# Patient Record
Sex: Male | Born: 1950
Health system: Southern US, Community
[De-identification: ages and names within clinical notes are randomized; demographics above are authoritative.]

## PROBLEM LIST (undated history)

## (undated) DIAGNOSIS — I10 Essential (primary) hypertension: Secondary | ICD-10-CM

## (undated) DIAGNOSIS — Z9889 Other specified postprocedural states: Secondary | ICD-10-CM

## (undated) DIAGNOSIS — E119 Type 2 diabetes mellitus without complications: Secondary | ICD-10-CM

## (undated) DIAGNOSIS — R112 Nausea with vomiting, unspecified: Secondary | ICD-10-CM

## (undated) DIAGNOSIS — E785 Hyperlipidemia, unspecified: Secondary | ICD-10-CM

## (undated) DIAGNOSIS — H269 Unspecified cataract: Secondary | ICD-10-CM

## (undated) DIAGNOSIS — T8859XA Other complications of anesthesia, initial encounter: Secondary | ICD-10-CM

## (undated) HISTORY — PX: REMOVE AND REPLACE LENS: SHX6308

## (undated) HISTORY — PX: LASIK: SHX215

## (undated) HISTORY — DX: Unspecified cataract: H26.9

## (undated) HISTORY — PX: CATARACT EXTRACTION, BILATERAL: SHX1313

## (undated) HISTORY — PX: EYE SURGERY: SHX253

---

## 1981-03-29 HISTORY — PX: CYST REMOVAL NECK: SHX6281

## 2017-03-12 ENCOUNTER — Encounter (HOSPITAL_COMMUNITY): Payer: Self-pay

## 2017-03-12 ENCOUNTER — Emergency Department (HOSPITAL_COMMUNITY): Payer: 59

## 2017-03-12 ENCOUNTER — Emergency Department (HOSPITAL_COMMUNITY)
Admission: EM | Admit: 2017-03-12 | Discharge: 2017-03-12 | Disposition: A | Discharge status: Home / Self Care | Payer: 59 | Attending: Emergency Medicine | Admitting: Emergency Medicine

## 2017-03-12 ENCOUNTER — Other Ambulatory Visit: Payer: Self-pay

## 2017-03-12 DIAGNOSIS — Z79899 Other long term (current) drug therapy: Secondary | ICD-10-CM | POA: Diagnosis not present

## 2017-03-12 DIAGNOSIS — L03115 Cellulitis of right lower limb: Secondary | ICD-10-CM | POA: Diagnosis not present

## 2017-03-12 DIAGNOSIS — M25461 Effusion, right knee: Secondary | ICD-10-CM

## 2017-03-12 DIAGNOSIS — M1711 Unilateral primary osteoarthritis, right knee: Secondary | ICD-10-CM | POA: Diagnosis not present

## 2017-03-12 DIAGNOSIS — M25561 Pain in right knee: Secondary | ICD-10-CM

## 2017-03-12 DIAGNOSIS — M7989 Other specified soft tissue disorders: Secondary | ICD-10-CM

## 2017-03-12 DIAGNOSIS — I10 Essential (primary) hypertension: Secondary | ICD-10-CM | POA: Diagnosis not present

## 2017-03-12 DIAGNOSIS — Z7984 Long term (current) use of oral hypoglycemic drugs: Secondary | ICD-10-CM | POA: Insufficient documentation

## 2017-03-12 DIAGNOSIS — E119 Type 2 diabetes mellitus without complications: Secondary | ICD-10-CM | POA: Diagnosis not present

## 2017-03-12 DIAGNOSIS — R2241 Localized swelling, mass and lump, right lower limb: Secondary | ICD-10-CM | POA: Diagnosis present

## 2017-03-12 HISTORY — DX: Essential (primary) hypertension: I10

## 2017-03-12 HISTORY — DX: Type 2 diabetes mellitus without complications: E11.9

## 2017-03-12 HISTORY — DX: Hyperlipidemia, unspecified: E78.5

## 2017-03-12 LAB — CBC WITH DIFFERENTIAL/PLATELET
Basophils Absolute: 0 10*3/uL (ref 0.0–0.1)
Basophils Relative: 0 %
EOS ABS: 0.2 10*3/uL (ref 0.0–0.7)
EOS PCT: 3 %
HCT: 39.1 % (ref 39.0–52.0)
HEMOGLOBIN: 12.7 g/dL — AB (ref 13.0–17.0)
LYMPHS ABS: 1 10*3/uL (ref 0.7–4.0)
Lymphocytes Relative: 12 %
MCH: 27.4 pg (ref 26.0–34.0)
MCHC: 32.5 g/dL (ref 30.0–36.0)
MCV: 84.3 fL (ref 78.0–100.0)
MONO ABS: 0.7 10*3/uL (ref 0.1–1.0)
MONOS PCT: 9 %
NEUTROS PCT: 76 %
Neutro Abs: 6.5 10*3/uL (ref 1.7–7.7)
Platelets: 280 10*3/uL (ref 150–400)
RBC: 4.64 MIL/uL (ref 4.22–5.81)
RDW: 13.2 % (ref 11.5–15.5)
WBC: 8.5 10*3/uL (ref 4.0–10.5)

## 2017-03-12 LAB — BASIC METABOLIC PANEL
Anion gap: 6 (ref 5–15)
BUN: 21 mg/dL — AB (ref 6–20)
CALCIUM: 8.4 mg/dL — AB (ref 8.9–10.3)
CO2: 26 mmol/L (ref 22–32)
CREATININE: 0.91 mg/dL (ref 0.61–1.24)
Chloride: 103 mmol/L (ref 101–111)
GFR calc Af Amer: >60 mL/min (ref >60)
GFR calc non Af Amer: >60 mL/min (ref >60)
GLUCOSE: 123 mg/dL — AB (ref 65–99)
Potassium: 3.4 mmol/L — ABNORMAL LOW (ref 3.5–5.1)
Sodium: 135 mmol/L (ref 135–145)

## 2017-03-12 LAB — URIC ACID: Uric Acid, Serum: 4.6 mg/dL (ref 4.4–7.6)

## 2017-03-12 LAB — D-DIMER, QUANTITATIVE (NOT AT ARMC): D DIMER QUANT: 3.18 ug{FEU}/mL — AB (ref 0.00–0.50)

## 2017-03-12 MED ORDER — PREDNISONE 10 MG PO TABS: 40.0000 mg | ORAL_TABLET | Freq: Every day | ORAL | 0 refills | Status: DC

## 2017-03-12 MED ORDER — DOXYCYCLINE HYCLATE 100 MG PO TABS
100.0000 mg | ORAL_TABLET | Freq: Once | ORAL | Status: AC
  Administered 2017-03-12: 100 mg via ORAL
  Filled 2017-03-12: qty 1

## 2017-03-12 MED ORDER — CEFAZOLIN SODIUM-DEXTROSE 1-4 GM/50ML-% IV SOLN
1.0000 g | Freq: Once | INTRAVENOUS | Status: AC
  Administered 2017-03-12: 1 g via INTRAVENOUS
  Filled 2017-03-12: qty 50

## 2017-03-12 MED ORDER — DOXYCYCLINE HYCLATE 100 MG PO CAPS: 100.0000 mg | ORAL_CAPSULE | Freq: Two times a day (BID) | ORAL | 0 refills | Status: DC

## 2017-03-12 MED ORDER — PREDNISONE 10 MG PO TABS
60.0000 mg | ORAL_TABLET | Freq: Once | ORAL | Status: AC
  Administered 2017-03-12: 60 mg via ORAL
  Filled 2017-03-12: qty 1

## 2017-03-12 MED ORDER — KETOROLAC TROMETHAMINE 30 MG/ML IJ SOLN
30.0000 mg | Freq: Once | INTRAMUSCULAR | Status: AC
  Administered 2017-03-12: 30 mg via INTRAVENOUS
  Filled 2017-03-12: qty 1

## 2017-03-12 MED ORDER — HYDROCODONE-ACETAMINOPHEN 5-325 MG PO TABS: 1.0000 | ORAL_TABLET | Freq: Four times a day (QID) | ORAL | 0 refills | Status: DC | PRN

## 2017-03-12 MED ORDER — IOPAMIDOL (ISOVUE-370) INJECTION 76%
100.0000 mL | Freq: Once | INTRAVENOUS | Status: AC | PRN
  Administered 2017-03-12: 100 mL via INTRAVENOUS

## 2017-03-12 NOTE — ED Notes (Signed)
CT made aware that the pt had an order for an [place] of his right lower leg.

## 2017-03-12 NOTE — Discharge Instructions (Signed)
Take prednisone as directed.  Also take the antibiotic doxycycline for the next 7 days.  Elevate right leg is much as possible.  Workup shows no evidence of any blood clots.  Also short course of pain medicine provided as needed.  Return for any new or worse symptoms

## 2017-03-12 NOTE — ED Notes (Signed)
Pt taken to US

## 2017-03-12 NOTE — ED Triage Notes (Signed)
Pt has swelling, redness and pain to right leg from the knee down worsening over the past 3 days.

## 2017-03-12 NOTE — ED Provider Notes (Signed)
[facility] EMERGENCY DEPARTMENT Provider Note   CSN: [id number] Arrival date & time: 03/12/17  0058  Time seen 02:45 AM   History   Chief Complaint Chief Complaint  Patient presents with  . Leg Swelling    HPI Malik Bowen is a 66 y.o. male.  HPI patient states December 7 he had pain in the MTP joint of his right great toe that was gone the following day.  The following day, December 8 he started having the pain and swelling in his right knee.  He states over the past 3 days he has started getting swelling and achiness of his whole right lower leg and now up into the right thigh.  He describes it as an aching discomfort.  He thinks he has had fever and chills that started December 9.  They are undocumented.  He denies nausea or vomiting.  He denies chest pain or shortness of breath.  He states he has been using ice without relief.  He states he has never had this happen before.  He denies any prior history of gout, he denies any family history of gout.  He states however he has had injections in his right knee at least twice in the past, the last time was a couple years ago.  This was done by his doctors in [place].  He states he flies to [place] several times a month, the last time was about 2 weeks ago.  He states he flies by private jet and he is in the plane about an hour.  He also does a lot of computer work and has been sitting at his desk working especially with the snow we have had.  PCP [facility], first appointment January 2  Past Medical History:  Diagnosis Date  . Diabetes mellitus without complication ([place])   . Hyperlipidemia   . Hypertension     There are no active problems to display for this patient.   History reviewed. No pertinent surgical history.     Home Medications    Prior to Admission medications   Medication Sig Start Date End Date Taking? Authorizing Provider  atorvastatin (LIPITOR) 40 MG tablet Take 40 mg by mouth daily.   Yes [provider]  losartan-hydrochlorothiazide (HYZAAR) 100-25 MG tablet Take 1 tablet by mouth daily.   Yes [provider]  metFORMIN (GLUCOPHAGE) 500 MG tablet Take by mouth 2 (two) times daily with a meal.   Yes [provider]  chlorhexidine (PERIDEX) 0.12 % solution TAKE ONE CAPFUL, SWISH, AND HOLD FOR 2 MINUTES THE SPIT TWICE A DAY 01/21/17   [provider]  tadalafil (CIALIS) 20 MG tablet  12/31/16   [provider]    Family History No family history on file.  Social History Social History   Tobacco Use  . Smoking status: Never Smoker  . Smokeless tobacco: Never Used  Substance Use Topics  . Alcohol use: No    Frequency: Never  . Drug use: No  employed   Allergies   Patient has no known allergies.   Review of Systems Review of Systems  All other systems reviewed and are negative.    Physical Exam Updated Vital Signs BP (!) 141/76 (BP Location: Right Arm)   Pulse 79   Temp 98 F (36.7 C) (Oral)   Resp 18   Ht 5\' 11"  (1.803 m)   Wt 107 kg (236 lb)   SpO2 97%   BMI 32.92 kg/m   Vital  signs normal    Physical Exam  Constitutional: He is oriented to person, place, and time. He appears well-developed and well-nourished. No distress.  HENT:  Head: Normocephalic and atraumatic.  Right Ear: External ear normal.  Left Ear: External ear normal.  Nose: Nose normal.  Eyes: Conjunctivae and EOM are normal.  Neck: Normal range of motion.  Cardiovascular: Normal rate.  Pulmonary/Chest: Effort normal. No respiratory distress.  Musculoskeletal: He exhibits edema and tenderness.  Patient is noted to have diffuse swelling of his right leg that starts just above his right knee.  He has a lot of redness and warmth to touch especially on the medial aspect of his lower leg between the knee and the ankle.  He has pitting edema of the lower leg and on the dorsum of his foot.  He currently has MTP joint is nontender to palpation but he  indicates that is where the pain initially started.  He appears to have a moderate effusion of his right knee however he does not have pain when I palpate the joint spaces or his patella.  Neurological: He is alert and oriented to person, place, and time. No cranial nerve deficit.  Skin: Skin is warm and dry. There is erythema.  Nursing note and vitals reviewed.      ED Treatments / Results  Labs (all labs ordered are listed, but only abnormal results are displayed) Results for orders placed or performed during the hospital encounter of 81/01/75  Basic metabolic panel  Result Value Ref Range   Sodium 135 135 - 145 mmol/L   Potassium 3.4 (L) 3.5 - 5.1 mmol/L   Chloride 103 101 - 111 mmol/L   CO2 26 22 - 32 mmol/L   Glucose, Bld 123 (H) 65 - 99 mg/dL   BUN 21 (H) 6 - 20 mg/dL   Creatinine, Ser 0.91 0.61 - 1.24 mg/dL   Calcium 8.4 (L) 8.9 - 10.3 mg/dL   GFR calc non Af Amer >60 >60 mL/min   GFR calc Af Amer >60 >60 mL/min   Anion gap 6 5 - 15  CBC with Differential  Result Value Ref Range   WBC 8.5 4.0 - 10.5 K/uL   RBC 4.64 4.22 - 5.81 MIL/uL   Hemoglobin 12.7 (L) 13.0 - 17.0 g/dL   HCT 39.1 39.0 - 52.0 %   MCV 84.3 78.0 - 100.0 fL   MCH 27.4 26.0 - 34.0 pg   MCHC 32.5 30.0 - 36.0 g/dL   RDW 13.2 11.5 - 15.5 %   Platelets 280 150 - 400 K/uL   Neutrophils Relative % 76 %   Neutro Abs 6.5 1.7 - 7.7 K/uL   Lymphocytes Relative 12 %   Lymphs Abs 1.0 0.7 - 4.0 K/uL   Monocytes Relative 9 %   Monocytes Absolute 0.7 0.1 - 1.0 K/uL   Eosinophils Relative 3 %   Eosinophils Absolute 0.2 0.0 - 0.7 K/uL   Basophils Relative 0 %   Basophils Absolute 0.0 0.0 - 0.1 K/uL  D-dimer, quantitative  Result Value Ref Range   D-Dimer, Quant 3.18 (H) 0.00 - 0.50 ug/mL-FEU  Uric acid  Result Value Ref Range   Uric Acid, Serum 4.6 4.4 - 7.6 mg/dL    Laboratory interpretation all normal except elevated d-dimer     EKG  EKG Interpretation None       Radiology Ct Angio Chest Pe  W/cm &/or Wo Cm  Result Date: 03/12/2017 CLINICAL DATA:  Acute onset of right leg  swelling, erythema and pain. Elevated D-dimer. EXAM: CT ANGIOGRAPHY CHEST WITH CONTRAST TECHNIQUE: Multidetector CT imaging of the chest was performed using the standard protocol during bolus administration of intravenous contrast. Multiplanar CT image reconstructions and MIPs were obtained to evaluate the vascular anatomy. CONTRAST:  191mL ISOVUE-370 IOPAMIDOL (ISOVUE-370) INJECTION 76% COMPARISON:  None. FINDINGS: Cardiovascular:  There is no evidence of pulmonary embolus. The heart is normal in size. The thoracic aorta is unremarkable. The great vessels are within normal limits. Mediastinum/Nodes: The mediastinum is unremarkable appearance. No mediastinal lymphadenopathy is seen. No pericardial effusion is identified. The thyroid gland is unremarkable. No axillary lymphadenopathy is appreciated. Lungs/Pleura: The lungs are essentially clear bilaterally. No focal consolidation, pleural effusion or pneumothorax is seen. No masses are identified. Upper Abdomen: The visualized portions of the liver and spleen are unremarkable. The visualized portions of the gallbladder, pancreas and adrenal glands are within normal limits. Musculoskeletal: No acute osseous abnormalities are identified. The visualized musculature is unremarkable in appearance. Review of the MIP images confirms the above findings. IMPRESSION: 1. No evidence of pulmonary embolus. 2. Lungs clear bilaterally. Electronically Signed   By: Garald Balding M.D.   On: 03/12/2017 04:41   Dg Knee Complete 4 Views Right  Result Date: 03/12/2017 CLINICAL DATA:  Right knee pain, redness and swelling. EXAM: RIGHT KNEE - COMPLETE 4+ VIEW COMPARISON:  None. FINDINGS: No fracture or subluxation. Medial tibiofemoral and patellofemoral joint space narrowing. Tricompartmental peripheral spurring. No bony destructive change. There is a moderate joint effusion. Mild soft tissue edema.  IMPRESSION: Moderate tricompartmental osteoarthritis. Moderate joint effusion. No acute bony abnormality. Electronically Signed   By: Jeb Levering M.D.   On: 03/12/2017 04:08    Procedures Procedures (including critical care time)  Medications Ordered in ED Medications  ketorolac (TORADOL) 30 MG/ML injection 30 mg (30 mg Intravenous Given 03/12/17 0320)  iopamidol (ISOVUE-370) 76 % injection 100 mL (100 mLs Intravenous Contrast Given 03/12/17 0429)  ceFAZolin (ANCEF) IVPB 1 g/50 mL premix (0 g Intravenous Stopped 03/12/17 0703)     Initial Impression / Assessment and Plan / ED Course  I have reviewed the triage vital signs and the nursing notes.  Pertinent labs & imaging results that were available during my care of the patient were reviewed by me and considered in my medical decision making (see chart for details).     The patient's symptoms are very suspicious for acute gout, although with his sedentary job and flying concern is also for DVT.  He also could have cellulitis.  Laboratory testing was done including screen for blood clots and gout.  He was given IV Toradol.  X-rays obtained of his right knee.  Recheck at 4:20 AM patient states his pain is better after the Toradol.  We discussed his test results including the elevated d-dimer.  CT angiogram of the chest was done to rule out PE, I am unable to assess his lower leg for DVT because they are not available at night.  Recheck at 5:35 AM patient was given the results of his CT scan.  He is willing to stay in the ED and get the venous Doppler ultrasound done to look for DVT.  Cellulitis is still consideration although he does not have fever or an elevated white blood cell count.  He was given IV antibiotics while waiting to be evaluated for DVT.  He states his pain is much improved after the IV Toradol and he is now able to sleep.  8:20 AM patient turned  over to Dr. Rogene Houston at change of shift to get the results of his Doppler  ultrasound.  If it was negative he will be discharged with anti-inflammatory and antibiotics, if he has DVT he will need to be started on a blood thinner.  Final Clinical Impressions(s) / ED Diagnoses   Final diagnoses:  Pain and swelling of right knee  Cellulitis of right lower extremity  Arthritis of right knee    Disposition pending  Rolland Porter, MD, Barbette Or, MD 03/12/17 [phone]

## 2017-03-12 NOTE — ED Provider Notes (Signed)
Doppler studies of the right leg without evidence of any blood clot.  Patient does have some erythema to the anterior shin as well as swelling to the knee and ankle.  Will treat as if this could be inflammatory arthritis which she has had trouble with in the past and possible component of cellulitis.  Will treat with prednisone pain medication in the antibiotic doxycycline.  Patient is already received Ancef here.   Fredia Sorrow, MD 03/12/17 1126

## 2017-06-01 DIAGNOSIS — M9902 Segmental and somatic dysfunction of thoracic region: Secondary | ICD-10-CM | POA: Diagnosis not present

## 2017-06-01 DIAGNOSIS — M9903 Segmental and somatic dysfunction of lumbar region: Secondary | ICD-10-CM | POA: Diagnosis not present

## 2017-06-01 DIAGNOSIS — M545 Low back pain: Secondary | ICD-10-CM | POA: Diagnosis not present

## 2017-06-01 DIAGNOSIS — M542 Cervicalgia: Secondary | ICD-10-CM | POA: Diagnosis not present

## 2017-06-01 DIAGNOSIS — M9901 Segmental and somatic dysfunction of cervical region: Secondary | ICD-10-CM | POA: Diagnosis not present

## 2017-06-17 DIAGNOSIS — M9902 Segmental and somatic dysfunction of thoracic region: Secondary | ICD-10-CM | POA: Diagnosis not present

## 2017-06-17 DIAGNOSIS — M545 Low back pain: Secondary | ICD-10-CM | POA: Diagnosis not present

## 2017-06-17 DIAGNOSIS — M542 Cervicalgia: Secondary | ICD-10-CM | POA: Diagnosis not present

## 2017-06-17 DIAGNOSIS — M9901 Segmental and somatic dysfunction of cervical region: Secondary | ICD-10-CM | POA: Diagnosis not present

## 2017-06-17 DIAGNOSIS — M9903 Segmental and somatic dysfunction of lumbar region: Secondary | ICD-10-CM | POA: Diagnosis not present

## 2017-06-30 DIAGNOSIS — M25531 Pain in right wrist: Secondary | ICD-10-CM | POA: Diagnosis not present

## 2017-06-30 DIAGNOSIS — M25431 Effusion, right wrist: Secondary | ICD-10-CM | POA: Diagnosis not present

## 2017-06-30 DIAGNOSIS — A6920 Lyme disease, unspecified: Secondary | ICD-10-CM | POA: Diagnosis not present

## 2017-07-01 ENCOUNTER — Ambulatory Visit (HOSPITAL_COMMUNITY)
Admission: RE | Admit: 2017-07-01 | Discharge: 2017-07-01 | Disposition: A | Discharge status: Home / Self Care | Payer: Medicare Other | Source: Ambulatory Visit | Attending: Physician Assistant | Admitting: Physician Assistant

## 2017-07-01 ENCOUNTER — Other Ambulatory Visit (HOSPITAL_COMMUNITY): Payer: Self-pay | Admitting: Physician Assistant

## 2017-07-01 DIAGNOSIS — E6609 Other obesity due to excess calories: Secondary | ICD-10-CM | POA: Diagnosis not present

## 2017-07-01 DIAGNOSIS — M25531 Pain in right wrist: Secondary | ICD-10-CM

## 2017-07-01 DIAGNOSIS — M25431 Effusion, right wrist: Secondary | ICD-10-CM | POA: Diagnosis not present

## 2017-07-01 DIAGNOSIS — Z6835 Body mass index (BMI) 35.0-35.9, adult: Secondary | ICD-10-CM | POA: Insufficient documentation

## 2017-07-01 DIAGNOSIS — M189 Osteoarthritis of first carpometacarpal joint, unspecified: Secondary | ICD-10-CM | POA: Diagnosis not present

## 2017-07-11 DIAGNOSIS — M9903 Segmental and somatic dysfunction of lumbar region: Secondary | ICD-10-CM | POA: Diagnosis not present

## 2017-07-11 DIAGNOSIS — M9902 Segmental and somatic dysfunction of thoracic region: Secondary | ICD-10-CM | POA: Diagnosis not present

## 2017-07-11 DIAGNOSIS — M542 Cervicalgia: Secondary | ICD-10-CM | POA: Diagnosis not present

## 2017-07-11 DIAGNOSIS — M545 Low back pain: Secondary | ICD-10-CM | POA: Diagnosis not present

## 2017-07-11 DIAGNOSIS — M9901 Segmental and somatic dysfunction of cervical region: Secondary | ICD-10-CM | POA: Diagnosis not present

## 2017-07-19 DIAGNOSIS — M13831 Other specified arthritis, right wrist: Secondary | ICD-10-CM | POA: Diagnosis not present

## 2017-07-19 DIAGNOSIS — M19039 Primary osteoarthritis, unspecified wrist: Secondary | ICD-10-CM | POA: Insufficient documentation

## 2017-07-21 DIAGNOSIS — R202 Paresthesia of skin: Secondary | ICD-10-CM | POA: Diagnosis not present

## 2017-07-21 DIAGNOSIS — R7309 Other abnormal glucose: Secondary | ICD-10-CM | POA: Diagnosis not present

## 2017-08-15 ENCOUNTER — Ambulatory Visit (INDEPENDENT_AMBULATORY_CARE_PROVIDER_SITE_OTHER): Payer: Medicare Other | Admitting: Ophthalmology

## 2017-08-15 ENCOUNTER — Encounter (INDEPENDENT_AMBULATORY_CARE_PROVIDER_SITE_OTHER): Payer: Self-pay | Admitting: Ophthalmology

## 2017-08-15 DIAGNOSIS — E119 Type 2 diabetes mellitus without complications: Secondary | ICD-10-CM | POA: Diagnosis not present

## 2017-08-15 DIAGNOSIS — H25813 Combined forms of age-related cataract, bilateral: Secondary | ICD-10-CM

## 2017-08-15 DIAGNOSIS — H43813 Vitreous degeneration, bilateral: Secondary | ICD-10-CM

## 2017-08-15 DIAGNOSIS — H3581 Retinal edema: Secondary | ICD-10-CM

## 2017-08-15 DIAGNOSIS — Z9889 Other specified postprocedural states: Secondary | ICD-10-CM | POA: Diagnosis not present

## 2017-08-15 NOTE — Progress Notes (Signed)
[facility] Note  08/15/2017     CHIEF COMPLAINT Patient presents for Retina Evaluation   HISTORY OF PRESENT ILLNESS: Malik Bowen is a 67 y.o. male who presents to the clinic today for:   HPI    Retina Evaluation    In both eyes.  Associated Symptoms Floaters.  Negative for Flashes, Pain, Trauma, Fever, Redness, Scalp Tenderness, Weight Loss, Fatigue, Jaw Claudication, Photophobia, Distortion, Blind Spot, Glare and Shoulder/Hip pain.  Context:  distance vision, mid-range vision and near vision.  Treatments tried include surgery.  Response to treatment was significant improvement.  I, the attending physician,  performed the HPI with the patient and updated documentation appropriately.          Comments    Patient presents today for retina  evaluation. Patient states"he is here to have  pre diabetic exam and to R/O mac. degeneration  , his wife Malik Bowen is a patient here. Pt reports he has had floaters for appx 50 yrs. ,he wears progressive lens, he states he has a sigmatism Os. His A1c was 6.1 three weeks ago. Pt reports he had Lasik sx OU 2002 in [place]. Denies gtt's       Last edited by Bernarda Caffey, MD on 08/15/2017  9:01 AM. (History)    Pt states he takes metformin for pre-diabetes;  Referring physician: Redmond School, MD [address] [place], [place] [address]  HISTORICAL INFORMATION:   Selected notes from the [place] referred for DM exam   CURRENT MEDICATIONS: No current outpatient medications on file. (Ophthalmic Drugs)   No current facility-administered medications for this visit.  (Ophthalmic Drugs)   Current Outpatient Medications (Other)  Medication Sig  . HYDROcodone-acetaminophen (NORCO/VICODIN) 5-325 MG tablet Take 1-2 tablets by mouth every 6 (six) hours as needed.  Marland Kitchen atorvastatin (LIPITOR) 40 MG tablet Take 40 mg by mouth daily.  Marland Kitchen doxycycline (VIBRAMYCIN) 100 MG capsule Take 1 capsule (100 mg total) by  mouth 2 (two) times daily. (Patient not taking: Reported on 08/15/2017)  . guaifenesin (ROBITUSSIN) 100 MG/5ML syrup Take 200 mg by mouth 3 (three) times daily as needed for cough.  . losartan-hydrochlorothiazide (HYZAAR) 100-25 MG tablet Take 1 tablet by mouth daily.  . metFORMIN (GLUCOPHAGE) 500 MG tablet Take 500 mg by mouth daily with breakfast.   . predniSONE (DELTASONE) 10 MG tablet Take 4 tablets (40 mg total) by mouth daily. (Patient not taking: Reported on 08/15/2017)  . tadalafil (CIALIS) 20 MG tablet Take 20 mg by mouth daily as needed.    No current facility-administered medications for this visit.  (Other)      REVIEW OF SYSTEMS: ROS    Positive for: Musculoskeletal, Endocrine, Eyes   Negative for: Constitutional, Gastrointestinal, Neurological, Skin, Genitourinary, HENT, Cardiovascular, Respiratory, Psychiatric, Allergic/Imm, Heme/Lymph   Last edited by Zenovia Jordan, LPN on 6/38/4536  4:68 AM. (History)       ALLERGIES Allergies  Allergen Reactions  . Other     Walnuts and strawberrys cause mouth sores    PAST MEDICAL HISTORY Past Medical History:  Diagnosis Date  . Diabetes mellitus without complication ([place])   . Hyperlipidemia   . Hypertension    Past Surgical History:  Procedure Laterality Date  . EYE SURGERY    . LASIK      FAMILY HISTORY Family History  Problem Relation Age of Onset  . Heart failure Father   . Heart failure Brother     SOCIAL HISTORY Social  History   Tobacco Use  . Smoking status: Never Smoker  . Smokeless tobacco: Never Used  Substance Use Topics  . Alcohol use: No    Frequency: Never  . Drug use: No         OPHTHALMIC EXAM:  Base Eye Exam    Visual Acuity (Bowen - Linear)      Right Left   Dist cc 20/20 20/20   Dist ph cc 20/20 20/20   Correction:  Glasses       Tonometry (Tonopen, 8:51 AM)      Right Left   Pressure 16 14       Pupils      Dark Light Shape React APD   Right 4 3 Round Brisk None    Left 4 3 Round Brisk None       Visual Fields (Counting fingers)      Left Right    Full Full       Extraocular Movement      Right Left    Full, Ortho Full, Ortho       Neuro/Psych    Oriented x3:  Yes   Mood/Affect:  Normal       Dilation    Both eyes:  1.0% Mydriacyl, 2.5% Phenylephrine @ 8:51 AM        Slit Lamp and Fundus Exam    Slit Lamp Exam      Right Left   Lids/Lashes Dermatochalasis - upper lid Dermatochalasis - upper lid   Conjunctiva/Sclera White and quiet White and quiet   Cornea Arcus, Debris in tear film, trace Punctate epithelial erosions, well healed lasik flap, 1+ Guttata Arcus, Debris in tear film, trace Punctate epithelial erosions, well healed lasik flap, 1+ Guttata   Anterior Chamber Deep and quiet Deep and quiet   Iris Round and dilated, No NVI Round and dilated, No NVI   Lens 2+ Nuclear sclerosis, 2+ Cortical cataract 2+ Nuclear sclerosis, 2+ Cortical cataract   Vitreous Vitreous syneresis, Posterior vitreous detachment, vitreous condesations inferiorly Vitreous syneresis, Posterior vitreous detachment, scattered vitreous condensations        Fundus Exam      Right Left   Disc Tilted disc, Temporal Peripapillary atrophy Tilted disc, Temporal Peripapillary atrophy and pigment   C/D Ratio 0.4 0.4   Macula Flat, Retinal pigment epithelial mottling, rare [place], No heme or edema Flat, mild Retinal pigment epithelial mottling, No heme or edema   Vessels Normal Trace AV crossing changes   Periphery Attached, pigmented Chorioretinal scar at 0730 Attached, pigmented Chorioretinal scarring at 0300        Refraction    Wearing Rx      Sphere Cylinder Axis Add   Right [place] +0.25 025 +2.50   Left -0.75 +1.00 158 +2.50   Age:  20yr   Type:  PAL          IMAGING AND PROCEDURES  Imaging and Procedures for @TODAY @  OCT, Retina - OU - Both Eyes       Right Eye Quality was good. Central Foveal Thickness: 297. Progression has no prior data.  Findings include normal foveal contour, no IRF, no SRF, epiretinal membrane (Mild ERM superior macula).   Left Eye Quality was good. Central Foveal Thickness: 284. Progression has no prior data. Findings include normal foveal contour, no IRF, no SRF (Trace ERM superior macula).   Notes *Images captured and stored on drive  Diagnosis / Impression:  No DME OU  Clinical management:  See  below  Abbreviations: NFP - Normal foveal profile. CME - cystoid macular edema. PED - pigment epithelial detachment. IRF - intraretinal fluid. SRF - subretinal fluid. EZ - ellipsoid zone. ERM - epiretinal membrane. ORA - outer retinal atrophy. ORT - outer retinal tubulation. SRHM - subretinal hyper-reflective material                 ASSESSMENT/PLAN:    ICD-10-CM   1. Diabetes mellitus type 2 without retinopathy ([place]) E11.9   2. Posterior vitreous detachment of both eyes H43.813   3. Retinal edema H35.81 OCT, Retina - OU - Both Eyes  4. Hx of LASIK Z98.890   5. Combined forms of age-related cataract of both eyes H25.813     1. Diabetes mellitus, type 2 without retinopathy - The incidence, risk factors for progression, natural history and treatment options for diabetic retinopathy  were discussed with patient.   - The need for close monitoring of blood glucose, blood pressure, and serum lipids, avoiding cigarette or any type of tobacco, and the need for long term follow up was also discussed with patient. - f/u in 1 year, sooner prn  2. PVD / vitreous syneresis - fairly prominent vitreous condensations - subjectively stable  - Discussed findings and prognosis - No RT or RD on 360 peripheral exam - Reviewed s/s of RT/RD - Strict return precautions for any such RT/RD signs/symptoms   2. No retinal edema on exam or OCT  3. Hx of Lasik OU-  - doing well - monitor  4. Combined forma age-related cataracts OU-  - The symptoms of cataract, surgical options, and treatments and risks were  discussed with patient. - discussed diagnosis and progression - not yet visually significant - monitor for now   Ophthalmic Meds Ordered this visit:  No orders of the defined types were placed in this encounter.      Return in about 1 year (around 08/16/2018) for DM exam, DFE, OCT.  There are no Patient Instructions on file for this visit.   Explained the diagnoses, plan, and follow up with the patient and they expressed understanding.  Patient expressed understanding of the importance of proper follow up care.  This document serves as a record of services personally performed by Gardiner Sleeper, MD, PhD. It was created on their behalf by Catha Brow, [place], a certified ophthalmic assistant. The creation of this record is the provider's dictation and/or activities during the visit.  Electronically signed by: Catha Brow, COA  05.20.19 9:49 AM     Gardiner Sleeper, M.D., Ph.D. Diseases & Surgery of the Retina and [place] 05.20.19  I have reviewed the above documentation for accuracy and completeness, and I agree with the above. Gardiner Sleeper, M.D., Ph.D. 08/15/17 9:50 AM     Abbreviations: M myopia (nearsighted); A astigmatism; H hyperopia (farsighted); P presbyopia; Mrx spectacle prescription;  CTL contact lenses; OD right eye; OS left eye; OU both eyes  XT exotropia; ET esotropia; PEK punctate epithelial keratitis; PEE punctate epithelial erosions; DES dry eye syndrome; MGD meibomian gland dysfunction; ATs artificial tears; PFAT's preservative free artificial tears; [place] nuclear sclerotic cataract; PSC posterior subcapsular cataract; ERM epi-retinal membrane; PVD posterior vitreous detachment; RD retinal detachment; DM diabetes mellitus; DR diabetic retinopathy; NPDR non-proliferative diabetic retinopathy; PDR proliferative diabetic retinopathy; CSME clinically significant macular edema; DME diabetic macular edema; dbh dot blot  hemorrhages; CWS cotton wool spot; POAG primary open angle glaucoma; C/D cup-to-disc ratio; HVF humphrey visual  field; GVF goldmann visual field; OCT optical coherence tomography; IOP intraocular pressure; BRVO Branch retinal vein occlusion; CRVO central retinal vein occlusion; CRAO central retinal artery occlusion; BRAO branch retinal artery occlusion; RT retinal tear; SB scleral buckle; PPV pars plana vitrectomy; VH Vitreous hemorrhage; PRP panretinal laser photocoagulation; IVK intravitreal kenalog; VMT vitreomacular traction; MH Macular hole;  NVD neovascularization of the disc; NVE neovascularization elsewhere; AREDS age related eye disease study; ARMD age related macular degeneration; POAG primary open angle glaucoma; EBMD epithelial/anterior basement membrane dystrophy; ACIOL anterior chamber intraocular lens; IOL intraocular lens; PCIOL posterior chamber intraocular lens; Phaco/IOL phacoemulsification with intraocular lens placement; [place] photorefractive keratectomy; LASIK laser assisted in situ keratomileusis; HTN hypertension; DM diabetes mellitus; COPD chronic obstructive pulmonary disease

## 2017-08-17 DIAGNOSIS — M545 Low back pain: Secondary | ICD-10-CM | POA: Diagnosis not present

## 2017-08-17 DIAGNOSIS — M9903 Segmental and somatic dysfunction of lumbar region: Secondary | ICD-10-CM | POA: Diagnosis not present

## 2017-08-17 DIAGNOSIS — M9902 Segmental and somatic dysfunction of thoracic region: Secondary | ICD-10-CM | POA: Diagnosis not present

## 2017-08-17 DIAGNOSIS — M9901 Segmental and somatic dysfunction of cervical region: Secondary | ICD-10-CM | POA: Diagnosis not present

## 2017-08-17 DIAGNOSIS — M542 Cervicalgia: Secondary | ICD-10-CM | POA: Diagnosis not present

## 2017-09-05 DIAGNOSIS — M545 Low back pain: Secondary | ICD-10-CM | POA: Diagnosis not present

## 2017-09-05 DIAGNOSIS — M9906 Segmental and somatic dysfunction of lower extremity: Secondary | ICD-10-CM | POA: Diagnosis not present

## 2017-09-05 DIAGNOSIS — M9902 Segmental and somatic dysfunction of thoracic region: Secondary | ICD-10-CM | POA: Diagnosis not present

## 2017-09-05 DIAGNOSIS — M9905 Segmental and somatic dysfunction of pelvic region: Secondary | ICD-10-CM | POA: Diagnosis not present

## 2017-09-05 DIAGNOSIS — M9903 Segmental and somatic dysfunction of lumbar region: Secondary | ICD-10-CM | POA: Diagnosis not present

## 2017-09-06 DIAGNOSIS — E782 Mixed hyperlipidemia: Secondary | ICD-10-CM | POA: Diagnosis not present

## 2017-09-06 DIAGNOSIS — I10 Essential (primary) hypertension: Secondary | ICD-10-CM | POA: Diagnosis not present

## 2017-11-03 DIAGNOSIS — M5441 Lumbago with sciatica, right side: Secondary | ICD-10-CM | POA: Diagnosis not present

## 2017-11-03 DIAGNOSIS — M9903 Segmental and somatic dysfunction of lumbar region: Secondary | ICD-10-CM | POA: Diagnosis not present

## 2017-11-03 DIAGNOSIS — M9905 Segmental and somatic dysfunction of pelvic region: Secondary | ICD-10-CM | POA: Diagnosis not present

## 2017-11-03 DIAGNOSIS — M9902 Segmental and somatic dysfunction of thoracic region: Secondary | ICD-10-CM | POA: Diagnosis not present

## 2017-11-25 DIAGNOSIS — M9905 Segmental and somatic dysfunction of pelvic region: Secondary | ICD-10-CM | POA: Diagnosis not present

## 2017-11-25 DIAGNOSIS — M9903 Segmental and somatic dysfunction of lumbar region: Secondary | ICD-10-CM | POA: Diagnosis not present

## 2017-11-25 DIAGNOSIS — M5441 Lumbago with sciatica, right side: Secondary | ICD-10-CM | POA: Diagnosis not present

## 2017-11-25 DIAGNOSIS — M9902 Segmental and somatic dysfunction of thoracic region: Secondary | ICD-10-CM | POA: Diagnosis not present

## 2017-12-22 DIAGNOSIS — M545 Low back pain: Secondary | ICD-10-CM | POA: Diagnosis not present

## 2017-12-22 DIAGNOSIS — Z1389 Encounter for screening for other disorder: Secondary | ICD-10-CM | POA: Diagnosis not present

## 2017-12-22 DIAGNOSIS — M5410 Radiculopathy, site unspecified: Secondary | ICD-10-CM | POA: Diagnosis not present

## 2018-01-12 DIAGNOSIS — M9905 Segmental and somatic dysfunction of pelvic region: Secondary | ICD-10-CM | POA: Diagnosis not present

## 2018-01-12 DIAGNOSIS — M5441 Lumbago with sciatica, right side: Secondary | ICD-10-CM | POA: Diagnosis not present

## 2018-01-12 DIAGNOSIS — M9903 Segmental and somatic dysfunction of lumbar region: Secondary | ICD-10-CM | POA: Diagnosis not present

## 2018-01-12 DIAGNOSIS — M9902 Segmental and somatic dysfunction of thoracic region: Secondary | ICD-10-CM | POA: Diagnosis not present

## 2018-01-30 DIAGNOSIS — Z23 Encounter for immunization: Secondary | ICD-10-CM | POA: Diagnosis not present

## 2018-01-30 DIAGNOSIS — M461 Sacroiliitis, not elsewhere classified: Secondary | ICD-10-CM | POA: Diagnosis not present

## 2018-01-30 DIAGNOSIS — M19042 Primary osteoarthritis, left hand: Secondary | ICD-10-CM | POA: Diagnosis not present

## 2018-02-14 DIAGNOSIS — Z23 Encounter for immunization: Secondary | ICD-10-CM | POA: Diagnosis not present

## 2018-03-08 DIAGNOSIS — M545 Low back pain: Secondary | ICD-10-CM | POA: Diagnosis not present

## 2018-03-08 DIAGNOSIS — M9903 Segmental and somatic dysfunction of lumbar region: Secondary | ICD-10-CM | POA: Diagnosis not present

## 2018-03-08 DIAGNOSIS — M9905 Segmental and somatic dysfunction of pelvic region: Secondary | ICD-10-CM | POA: Diagnosis not present

## 2018-03-08 DIAGNOSIS — M1991 Primary osteoarthritis, unspecified site: Secondary | ICD-10-CM | POA: Diagnosis not present

## 2018-03-08 DIAGNOSIS — Z0000 Encounter for general adult medical examination without abnormal findings: Secondary | ICD-10-CM | POA: Diagnosis not present

## 2018-03-08 DIAGNOSIS — E782 Mixed hyperlipidemia: Secondary | ICD-10-CM | POA: Diagnosis not present

## 2018-03-08 DIAGNOSIS — E119 Type 2 diabetes mellitus without complications: Secondary | ICD-10-CM | POA: Diagnosis not present

## 2018-03-08 DIAGNOSIS — R7309 Other abnormal glucose: Secondary | ICD-10-CM | POA: Diagnosis not present

## 2018-03-08 DIAGNOSIS — M9902 Segmental and somatic dysfunction of thoracic region: Secondary | ICD-10-CM | POA: Diagnosis not present

## 2018-03-08 DIAGNOSIS — Z1389 Encounter for screening for other disorder: Secondary | ICD-10-CM | POA: Diagnosis not present

## 2018-03-08 DIAGNOSIS — M5430 Sciatica, unspecified side: Secondary | ICD-10-CM | POA: Diagnosis not present

## 2018-03-08 DIAGNOSIS — Z0001 Encounter for general adult medical examination with abnormal findings: Secondary | ICD-10-CM | POA: Diagnosis not present

## 2018-03-14 DIAGNOSIS — M533 Sacrococcygeal disorders, not elsewhere classified: Secondary | ICD-10-CM | POA: Diagnosis not present

## 2018-04-21 DIAGNOSIS — M9902 Segmental and somatic dysfunction of thoracic region: Secondary | ICD-10-CM | POA: Diagnosis not present

## 2018-04-21 DIAGNOSIS — M9905 Segmental and somatic dysfunction of pelvic region: Secondary | ICD-10-CM | POA: Diagnosis not present

## 2018-04-21 DIAGNOSIS — M9903 Segmental and somatic dysfunction of lumbar region: Secondary | ICD-10-CM | POA: Diagnosis not present

## 2018-04-21 DIAGNOSIS — M545 Low back pain: Secondary | ICD-10-CM | POA: Diagnosis not present

## 2018-05-01 DIAGNOSIS — A6920 Lyme disease, unspecified: Secondary | ICD-10-CM | POA: Diagnosis not present

## 2018-05-17 DIAGNOSIS — M533 Sacrococcygeal disorders, not elsewhere classified: Secondary | ICD-10-CM | POA: Diagnosis not present

## 2018-05-26 DIAGNOSIS — M9903 Segmental and somatic dysfunction of lumbar region: Secondary | ICD-10-CM | POA: Diagnosis not present

## 2018-05-26 DIAGNOSIS — M9905 Segmental and somatic dysfunction of pelvic region: Secondary | ICD-10-CM | POA: Diagnosis not present

## 2018-05-26 DIAGNOSIS — M545 Low back pain: Secondary | ICD-10-CM | POA: Diagnosis not present

## 2018-05-26 DIAGNOSIS — M9902 Segmental and somatic dysfunction of thoracic region: Secondary | ICD-10-CM | POA: Diagnosis not present

## 2018-08-16 ENCOUNTER — Ambulatory Visit (INDEPENDENT_AMBULATORY_CARE_PROVIDER_SITE_OTHER): Payer: Medicare Other | Admitting: Ophthalmology

## 2018-08-16 ENCOUNTER — Other Ambulatory Visit: Payer: Self-pay

## 2018-08-16 ENCOUNTER — Encounter (INDEPENDENT_AMBULATORY_CARE_PROVIDER_SITE_OTHER): Payer: Self-pay | Admitting: Ophthalmology

## 2018-08-16 DIAGNOSIS — M25511 Pain in right shoulder: Secondary | ICD-10-CM | POA: Diagnosis not present

## 2018-08-16 DIAGNOSIS — M542 Cervicalgia: Secondary | ICD-10-CM | POA: Diagnosis not present

## 2018-08-16 DIAGNOSIS — R7309 Other abnormal glucose: Secondary | ICD-10-CM | POA: Diagnosis not present

## 2018-08-16 DIAGNOSIS — M9901 Segmental and somatic dysfunction of cervical region: Secondary | ICD-10-CM | POA: Diagnosis not present

## 2018-08-16 DIAGNOSIS — H3581 Retinal edema: Secondary | ICD-10-CM

## 2018-08-16 DIAGNOSIS — E119 Type 2 diabetes mellitus without complications: Secondary | ICD-10-CM

## 2018-08-16 DIAGNOSIS — Z9889 Other specified postprocedural states: Secondary | ICD-10-CM

## 2018-08-16 DIAGNOSIS — H43813 Vitreous degeneration, bilateral: Secondary | ICD-10-CM

## 2018-08-16 DIAGNOSIS — M9902 Segmental and somatic dysfunction of thoracic region: Secondary | ICD-10-CM | POA: Diagnosis not present

## 2018-08-16 DIAGNOSIS — H25813 Combined forms of age-related cataract, bilateral: Secondary | ICD-10-CM

## 2018-08-16 DIAGNOSIS — M9907 Segmental and somatic dysfunction of upper extremity: Secondary | ICD-10-CM | POA: Diagnosis not present

## 2018-08-16 NOTE — Progress Notes (Signed)
[facility] Note  08/16/2018     CHIEF COMPLAINT Patient presents for Retina Evaluation   HISTORY OF PRESENT ILLNESS: Malik Bowen is a 68 y.o. male who presents to the clinic today for:   HPI    Retina Evaluation    In both eyes.  Duration of 12 months.  Associated Symptoms Negative for Flashes, Blind Spot, Photophobia, Scalp Tenderness, Fever, Floaters, Pain, Glare, Jaw Claudication, Weight Loss, Distortion, Redness, Trauma, Shoulder/Hip pain and Fatigue.  Context:  distance vision, mid-range vision and near vision.  Treatments tried include no treatments.  I, the attending physician,  performed the HPI with the patient and updated documentation appropriately.          Comments    Patient states vision the same OU. Last a1c was 5.9. BS doesn't check regularly.        Last edited by Bernarda Caffey, MD on 08/16/2018  8:39 AM. (History)    Pt states he has been well over the past year, but his arthritis is getting pretty bad, he states he has not seen a rheumatologist yet, but plans to once things calm down from [place], he states his diabetes is under control, he states he sees the occasional floater  Referring physician: No referring provider defined for this encounter.  HISTORICAL INFORMATION:   Selected notes from the MEDICAL RECORD NUMBER Self referred for DM exam   CURRENT MEDICATIONS: No current outpatient medications on file. (Ophthalmic Drugs)   No current facility-administered medications for this visit.  (Ophthalmic Drugs)   Current Outpatient Medications (Other)  Medication Sig  . atorvastatin (LIPITOR) 40 MG tablet Take 40 mg by mouth daily.  Marland Kitchen doxycycline (VIBRAMYCIN) 100 MG capsule Take 1 capsule (100 mg total) by mouth 2 (two) times daily.  Marland Kitchen guaifenesin (ROBITUSSIN) 100 MG/5ML syrup Take 200 mg by mouth 3 (three) times daily as needed for cough.  Marland Kitchen HYDROcodone-acetaminophen (NORCO/VICODIN) 5-325 MG tablet Take 1-2 tablets by mouth  every 6 (six) hours as needed.  Marland Kitchen losartan-hydrochlorothiazide (HYZAAR) 100-25 MG tablet Take 1 tablet by mouth daily.  . metFORMIN (GLUCOPHAGE) 500 MG tablet Take 500 mg by mouth daily with breakfast.   . tadalafil (CIALIS) 20 MG tablet Take 20 mg by mouth daily as needed.   . predniSONE (DELTASONE) 10 MG tablet Take 4 tablets (40 mg total) by mouth daily. (Patient not taking: Reported on 08/15/2017)   No current facility-administered medications for this visit.  (Other)      REVIEW OF SYSTEMS: ROS    Positive for: Musculoskeletal, Endocrine, Eyes   Negative for: Constitutional, Gastrointestinal, Neurological, Skin, Genitourinary, HENT, Cardiovascular, Respiratory, Psychiatric, Allergic/Imm, Heme/Lymph   Last edited by Roselee Nova D on 08/16/2018  8:05 AM. (History)       ALLERGIES Allergies  Allergen Reactions  . Other     Walnuts and strawberrys cause mouth sores    PAST MEDICAL HISTORY Past Medical History:  Diagnosis Date  . Diabetes mellitus without complication ([place])   . Hyperlipidemia   . Hypertension    Past Surgical History:  Procedure Laterality Date  . EYE SURGERY    . LASIK      FAMILY HISTORY Family History  Problem Relation Age of Onset  . Heart failure Father   . Heart failure Brother     SOCIAL HISTORY Social History   Tobacco Use  . Smoking status: Never Smoker  . Smokeless tobacco: Never Used  Substance Use Topics  . Alcohol use:  No    Frequency: Never  . Drug use: No         OPHTHALMIC EXAM:  Base Eye Exam    Visual Acuity (Snellen - Linear)      Right Left   Dist cc 20/20 -2 20/25 +2   Dist ph cc  NI   Correction:  Glasses       Tonometry (Tonopen, 8:18 AM)      Right Left   Pressure 14 14       Pupils      Dark Light Shape React APD   Right 3 2 Round Brisk None   Left 3 2 Round Brisk None       Visual Fields (Counting fingers)      Left Right    Full Full       Extraocular Movement      Right Left    Full,  Ortho Full, Ortho       Neuro/Psych    Oriented x3:  Yes   Mood/Affect:  Normal       Dilation    Both eyes:  1.0% Mydriacyl, 2.5% Phenylephrine @ 8:18 AM        Slit Lamp and Fundus Exam    Slit Lamp Exam      Right Left   Lids/Lashes Dermatochalasis - upper lid, mild Meibomian gland dysfunction Dermatochalasis - upper lid, Meibomian gland dysfunction   Conjunctiva/Sclera mild Conjunctivochalasis inferiorly mild Conjunctivochalasis inferiorly   Cornea Arcus, 1+ Punctate epithelial erosions, barely visible lasik flap, 2+ [place] 1+ Punctate epithelial erosions, 2+ Guttata   Anterior Chamber Deep and quiet Deep and quiet   Iris Round and dilated, No NVI Round and dilated, No NVI   Lens 2+ Nuclear sclerosis, 2+ Cortical cataract 2-3+ Nuclear sclerosis, 2+ Cortical cataract   Vitreous Vitreous syneresis, Posterior vitreous detachment, vitreous condesations inferiorly Vitreous syneresis, Posterior vitreous detachment, scattered vitreous condensations        Fundus Exam      Right Left   Disc pink and sharp, Tilted disc, Temporal Peripapillary atrophy pink and sharp, Tilted disc, Temporal Peripapillary atrophy and pigment   C/D Ratio 0.4 0.4   Macula Flat, good foveal reflex, Retinal pigment epithelial mottling, No heme or edema Flat, blunted foveal reflex, mild Retinal pigment epithelial mottling, No heme or edema   Vessels Vascular attenuation Vascular attenuation   Periphery Attached, pigmented Chorioretinal scar at 0730, no heme Attached, pigmented Chorioretinal scarring at 0300, no heme        Refraction    Wearing Rx      Sphere Cylinder Axis Add   Right [place] +0.25 025 +2.50   Left -0.75 +1.00 158 +2.50   Type:  PAL          IMAGING AND PROCEDURES  Imaging and Procedures for @TODAY @  OCT, Retina - OU - Both Eyes       Right Eye Quality was good. Central Foveal Thickness: 295. Progression has been stable. Findings include normal foveal contour, no IRF, no SRF,  epiretinal membrane (Mild ERM superior macula).   Left Eye Quality was good. Central Foveal Thickness: 284. Progression has been stable. Findings include normal foveal contour, no IRF, no SRF (Trace ERM superior macula).   Notes *Images captured and stored on drive  Diagnosis / Impression:  No DME OU  Clinical management:  See below  Abbreviations: NFP - Normal foveal profile. CME - cystoid macular edema. PED - pigment epithelial detachment. IRF - intraretinal fluid. SRF -  subretinal fluid. EZ - ellipsoid zone. ERM - epiretinal membrane. ORA - outer retinal atrophy. ORT - outer retinal tubulation. SRHM - subretinal hyper-reflective material                 ASSESSMENT/PLAN:    ICD-10-CM   1. Diabetes mellitus type 2 without retinopathy ([place]) E11.9   2. Posterior vitreous detachment of both eyes H43.813   3. Retinal edema H35.81 OCT, Retina - OU - Both Eyes  4. Hx of LASIK Z98.890   5. Combined forms of age-related cataract of both eyes H25.813     1. Diabetes mellitus, type 2 without retinopathy  - stable from prior  - The incidence, risk factors for progression, natural history and treatment options for diabetic retinopathy  were discussed with patient.    - The need for close monitoring of blood glucose, blood pressure, and serum lipids, avoiding cigarette or any type of tobacco, and the need for long term follow up was also discussed with patient.  - f/u in 1 year, sooner prn  2. PVD / vitreous syneresis  - fairly prominent vitreous condensations  - subjectively stable   - Discussed findings and prognosis  - No RT or RD on 360 peripheral exam  - Reviewed s/s of RT/RD  - Strict return precautions for any such RT/RD signs/symptoms  3. No retinal edema on exam or OCT  4. Hx of Lasik OU-   - doing well  - monitor  5. Combined forma age-related cataracts OU-   - The symptoms of cataract, surgical options, and treatments and risks were discussed with patient.  -  discussed diagnosis and progression  - not yet visually significant  - monitor for now   Ophthalmic Meds Ordered this visit:  No orders of the defined types were placed in this encounter.      Return in about 1 year (around 08/16/2019) for DM exam, Dilated Exam, OCT.  There are no Patient Instructions on file for this visit.   Explained the diagnoses, plan, and follow up with the patient and they expressed understanding.  Patient expressed understanding of the importance of proper follow up care.  This document serves as a record of services personally performed by Gardiner Sleeper, MD, PhD. It was created on their behalf by Ernest Mallick, OA, an ophthalmic assistant. The creation of this record is the provider's dictation and/or activities during the visit.    Electronically signed by: Ernest Mallick, OA  05.20.2020 8:54 AM      Gardiner Sleeper, M.D., Ph.D. Diseases & Surgery of the Retina and Vitreous [facility]   I have reviewed the above documentation for accuracy and completeness, and I agree with the above. Gardiner Sleeper, M.D., Ph.D. 08/16/18 8:58 AM      Abbreviations: M myopia (nearsighted); A astigmatism; H hyperopia (farsighted); P presbyopia; Mrx spectacle prescription;  CTL contact lenses; OD right eye; OS left eye; OU both eyes  XT exotropia; ET esotropia; PEK punctate epithelial keratitis; PEE punctate epithelial erosions; DES dry eye syndrome; MGD meibomian gland dysfunction; ATs artificial tears; PFAT's preservative free artificial tears; [place] nuclear sclerotic cataract; PSC posterior subcapsular cataract; ERM epi-retinal membrane; PVD posterior vitreous detachment; RD retinal detachment; DM diabetes mellitus; DR diabetic retinopathy; NPDR non-proliferative diabetic retinopathy; PDR proliferative diabetic retinopathy; CSME clinically significant macular edema; DME diabetic macular edema; dbh dot blot hemorrhages; CWS cotton wool spot; POAG  primary open angle glaucoma; C/D cup-to-disc ratio; HVF humphrey visual field;  GVF goldmann visual field; OCT optical coherence tomography; IOP intraocular pressure; BRVO Branch retinal vein occlusion; CRVO central retinal vein occlusion; CRAO central retinal artery occlusion; BRAO branch retinal artery occlusion; RT retinal tear; SB scleral buckle; PPV pars plana vitrectomy; VH Vitreous hemorrhage; PRP panretinal laser photocoagulation; IVK intravitreal kenalog; VMT vitreomacular traction; MH Macular hole;  NVD neovascularization of the disc; NVE neovascularization elsewhere; AREDS age related eye disease study; ARMD age related macular degeneration; POAG primary open angle glaucoma; EBMD epithelial/anterior basement membrane dystrophy; ACIOL anterior chamber intraocular lens; IOL intraocular lens; PCIOL posterior chamber intraocular lens; Phaco/IOL phacoemulsification with intraocular lens placement; [place] photorefractive keratectomy; LASIK laser assisted in situ keratomileusis; HTN hypertension; DM diabetes mellitus; COPD chronic obstructive pulmonary disease

## 2018-08-28 DIAGNOSIS — I10 Essential (primary) hypertension: Secondary | ICD-10-CM | POA: Diagnosis not present

## 2018-09-12 DIAGNOSIS — E782 Mixed hyperlipidemia: Secondary | ICD-10-CM | POA: Diagnosis not present

## 2018-09-12 DIAGNOSIS — Z1389 Encounter for screening for other disorder: Secondary | ICD-10-CM | POA: Diagnosis not present

## 2018-09-12 DIAGNOSIS — R7309 Other abnormal glucose: Secondary | ICD-10-CM | POA: Diagnosis not present

## 2018-09-12 DIAGNOSIS — I10 Essential (primary) hypertension: Secondary | ICD-10-CM | POA: Diagnosis not present

## 2018-09-12 DIAGNOSIS — Z0001 Encounter for general adult medical examination with abnormal findings: Secondary | ICD-10-CM | POA: Diagnosis not present

## 2018-09-25 DIAGNOSIS — M9901 Segmental and somatic dysfunction of cervical region: Secondary | ICD-10-CM | POA: Diagnosis not present

## 2018-09-25 DIAGNOSIS — M9907 Segmental and somatic dysfunction of upper extremity: Secondary | ICD-10-CM | POA: Diagnosis not present

## 2018-09-25 DIAGNOSIS — M25511 Pain in right shoulder: Secondary | ICD-10-CM | POA: Diagnosis not present

## 2018-09-25 DIAGNOSIS — M542 Cervicalgia: Secondary | ICD-10-CM | POA: Diagnosis not present

## 2018-09-25 DIAGNOSIS — M9902 Segmental and somatic dysfunction of thoracic region: Secondary | ICD-10-CM | POA: Diagnosis not present

## 2018-10-04 DIAGNOSIS — R05 Cough: Secondary | ICD-10-CM | POA: Diagnosis not present

## 2018-12-20 DIAGNOSIS — M1811 Unilateral primary osteoarthritis of first carpometacarpal joint, right hand: Secondary | ICD-10-CM | POA: Diagnosis not present

## 2018-12-20 DIAGNOSIS — M180 Bilateral primary osteoarthritis of first carpometacarpal joints: Secondary | ICD-10-CM | POA: Diagnosis not present

## 2018-12-20 DIAGNOSIS — M1812 Unilateral primary osteoarthritis of first carpometacarpal joint, left hand: Secondary | ICD-10-CM | POA: Diagnosis not present

## 2018-12-20 DIAGNOSIS — M189 Osteoarthritis of first carpometacarpal joint, unspecified: Secondary | ICD-10-CM | POA: Insufficient documentation

## 2018-12-20 DIAGNOSIS — G5602 Carpal tunnel syndrome, left upper limb: Secondary | ICD-10-CM | POA: Diagnosis not present

## 2018-12-20 DIAGNOSIS — E7800 Pure hypercholesterolemia, unspecified: Secondary | ICD-10-CM | POA: Insufficient documentation

## 2018-12-20 DIAGNOSIS — M13842 Other specified arthritis, left hand: Secondary | ICD-10-CM | POA: Diagnosis not present

## 2018-12-20 DIAGNOSIS — M79642 Pain in left hand: Secondary | ICD-10-CM | POA: Diagnosis not present

## 2018-12-20 DIAGNOSIS — I10 Essential (primary) hypertension: Secondary | ICD-10-CM | POA: Insufficient documentation

## 2018-12-20 DIAGNOSIS — E119 Type 2 diabetes mellitus without complications: Secondary | ICD-10-CM | POA: Insufficient documentation

## 2018-12-25 DIAGNOSIS — M9902 Segmental and somatic dysfunction of thoracic region: Secondary | ICD-10-CM | POA: Diagnosis not present

## 2018-12-25 DIAGNOSIS — M546 Pain in thoracic spine: Secondary | ICD-10-CM | POA: Diagnosis not present

## 2018-12-25 DIAGNOSIS — M9905 Segmental and somatic dysfunction of pelvic region: Secondary | ICD-10-CM | POA: Diagnosis not present

## 2018-12-25 DIAGNOSIS — M9903 Segmental and somatic dysfunction of lumbar region: Secondary | ICD-10-CM | POA: Diagnosis not present

## 2018-12-25 DIAGNOSIS — M545 Low back pain: Secondary | ICD-10-CM | POA: Diagnosis not present

## 2019-02-02 DIAGNOSIS — Z23 Encounter for immunization: Secondary | ICD-10-CM | POA: Diagnosis not present

## 2019-02-09 ENCOUNTER — Other Ambulatory Visit: Payer: Self-pay | Admitting: *Deleted

## 2019-02-09 DIAGNOSIS — Z20822 Contact with and (suspected) exposure to covid-19: Secondary | ICD-10-CM

## 2019-02-12 DIAGNOSIS — M9903 Segmental and somatic dysfunction of lumbar region: Secondary | ICD-10-CM | POA: Diagnosis not present

## 2019-02-12 DIAGNOSIS — M9902 Segmental and somatic dysfunction of thoracic region: Secondary | ICD-10-CM | POA: Diagnosis not present

## 2019-02-12 DIAGNOSIS — M9905 Segmental and somatic dysfunction of pelvic region: Secondary | ICD-10-CM | POA: Diagnosis not present

## 2019-02-12 DIAGNOSIS — M545 Low back pain: Secondary | ICD-10-CM | POA: Diagnosis not present

## 2019-02-12 DIAGNOSIS — M546 Pain in thoracic spine: Secondary | ICD-10-CM | POA: Diagnosis not present

## 2019-02-12 LAB — NOVEL CORONAVIRUS, NAA: SARS-CoV-2, NAA: NOT DETECTED

## 2019-02-26 DIAGNOSIS — I10 Essential (primary) hypertension: Secondary | ICD-10-CM | POA: Diagnosis not present

## 2019-02-26 DIAGNOSIS — M19042 Primary osteoarthritis, left hand: Secondary | ICD-10-CM | POA: Diagnosis not present

## 2019-02-26 DIAGNOSIS — E7849 Other hyperlipidemia: Secondary | ICD-10-CM | POA: Diagnosis not present

## 2019-03-15 DIAGNOSIS — E559 Vitamin D deficiency, unspecified: Secondary | ICD-10-CM | POA: Diagnosis not present

## 2019-03-15 DIAGNOSIS — E7849 Other hyperlipidemia: Secondary | ICD-10-CM | POA: Diagnosis not present

## 2019-03-15 DIAGNOSIS — I10 Essential (primary) hypertension: Secondary | ICD-10-CM | POA: Diagnosis not present

## 2019-03-15 DIAGNOSIS — R7309 Other abnormal glucose: Secondary | ICD-10-CM | POA: Diagnosis not present

## 2019-03-15 DIAGNOSIS — E782 Mixed hyperlipidemia: Secondary | ICD-10-CM | POA: Diagnosis not present

## 2019-03-20 ENCOUNTER — Ambulatory Visit: Payer: Medicare Other | Attending: Physician Assistant

## 2019-03-20 ENCOUNTER — Other Ambulatory Visit: Payer: Self-pay

## 2019-03-20 DIAGNOSIS — Z20822 Contact with and (suspected) exposure to covid-19: Secondary | ICD-10-CM

## 2019-03-21 LAB — NOVEL CORONAVIRUS, NAA: SARS-CoV-2, NAA: NOT DETECTED

## 2019-04-03 DIAGNOSIS — L821 Other seborrheic keratosis: Secondary | ICD-10-CM | POA: Diagnosis not present

## 2019-04-03 DIAGNOSIS — L570 Actinic keratosis: Secondary | ICD-10-CM | POA: Diagnosis not present

## 2019-04-03 DIAGNOSIS — D229 Melanocytic nevi, unspecified: Secondary | ICD-10-CM | POA: Diagnosis not present

## 2019-04-19 ENCOUNTER — Ambulatory Visit: Payer: Medicare Other | Attending: Internal Medicine

## 2019-04-19 ENCOUNTER — Other Ambulatory Visit: Payer: Self-pay

## 2019-04-19 ENCOUNTER — Other Ambulatory Visit: Payer: Medicare Other

## 2019-04-19 DIAGNOSIS — Z20822 Contact with and (suspected) exposure to covid-19: Secondary | ICD-10-CM | POA: Diagnosis not present

## 2019-04-19 IMAGING — DX DG KNEE COMPLETE 4+V*R*
4 series · 4 of 4 positions shown · non-contrast
Comparison: None.

CLINICAL DATA: Right knee pain, redness and swelling.

EXAM:
RIGHT KNEE - COMPLETE 4+ VIEW

[knee ap (1 of 3)]
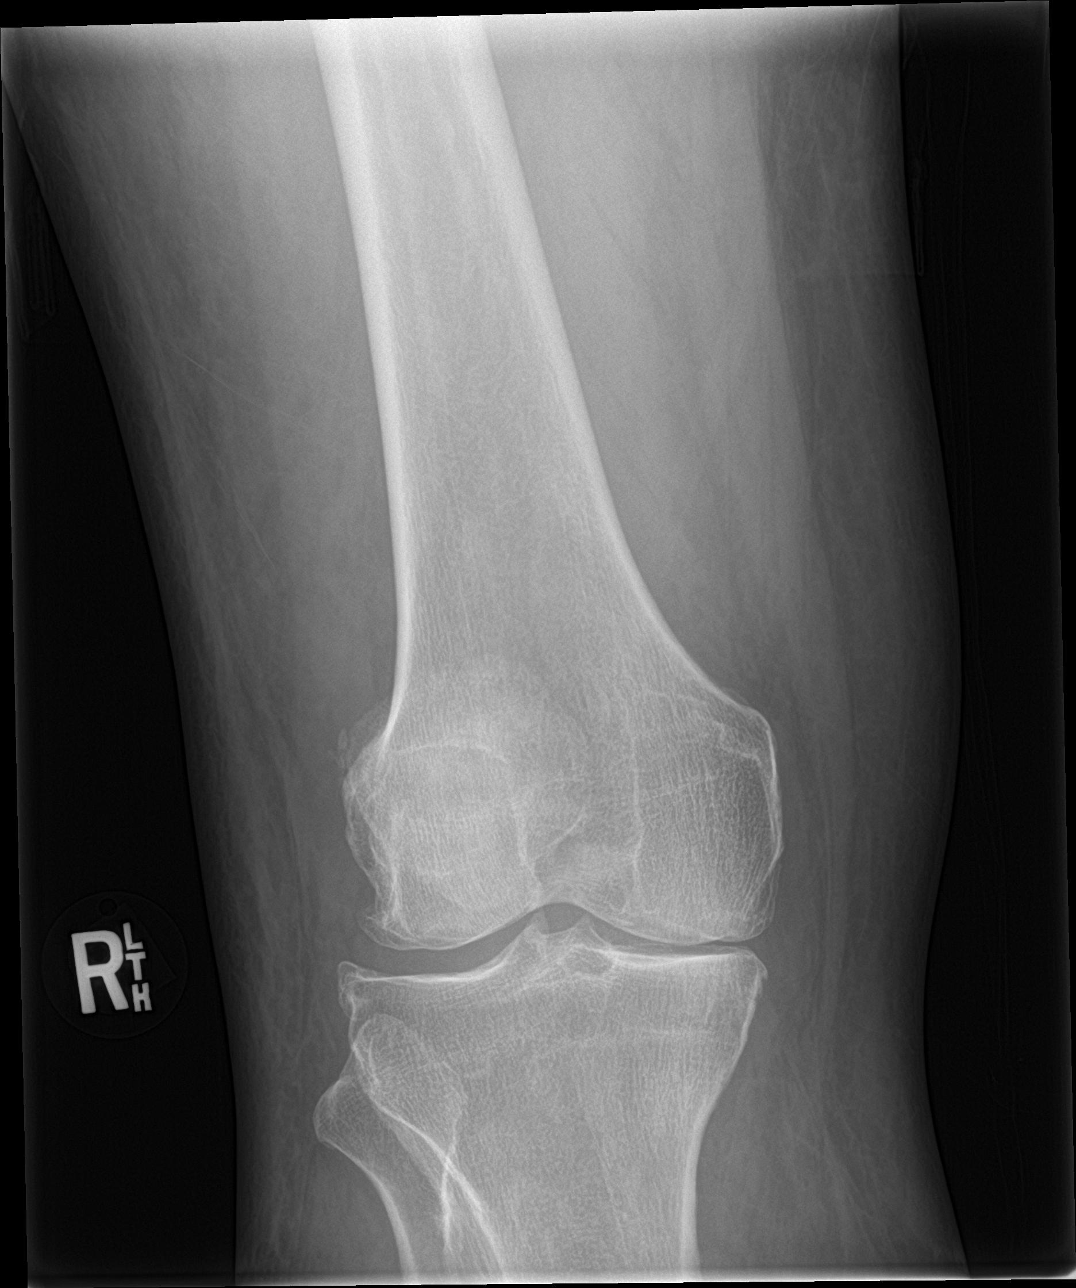

[knee lat]
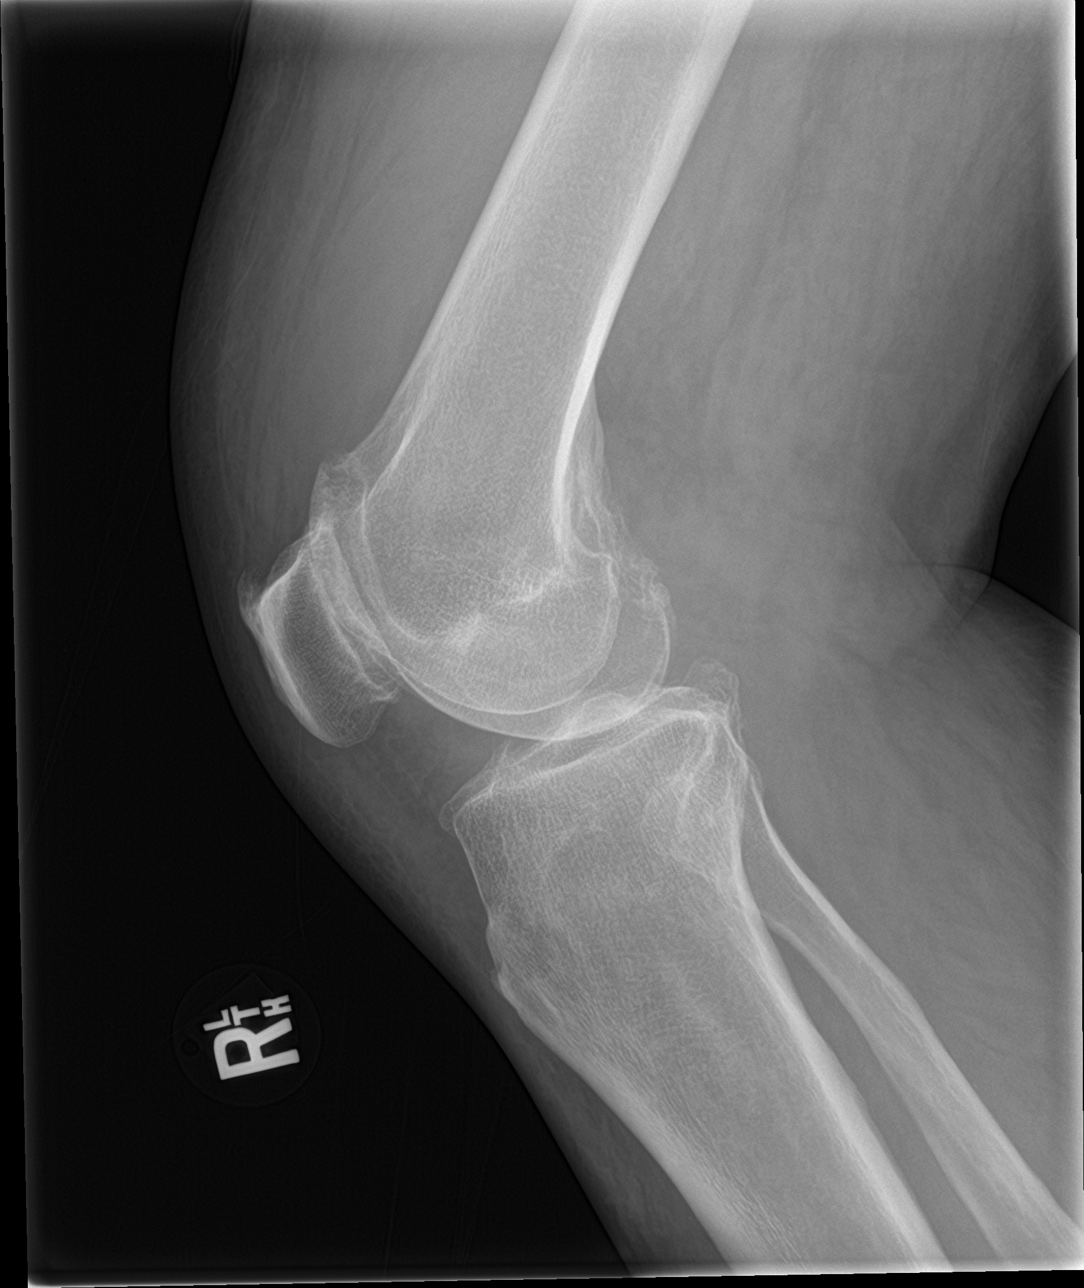

[knee ap (2 of 3)]
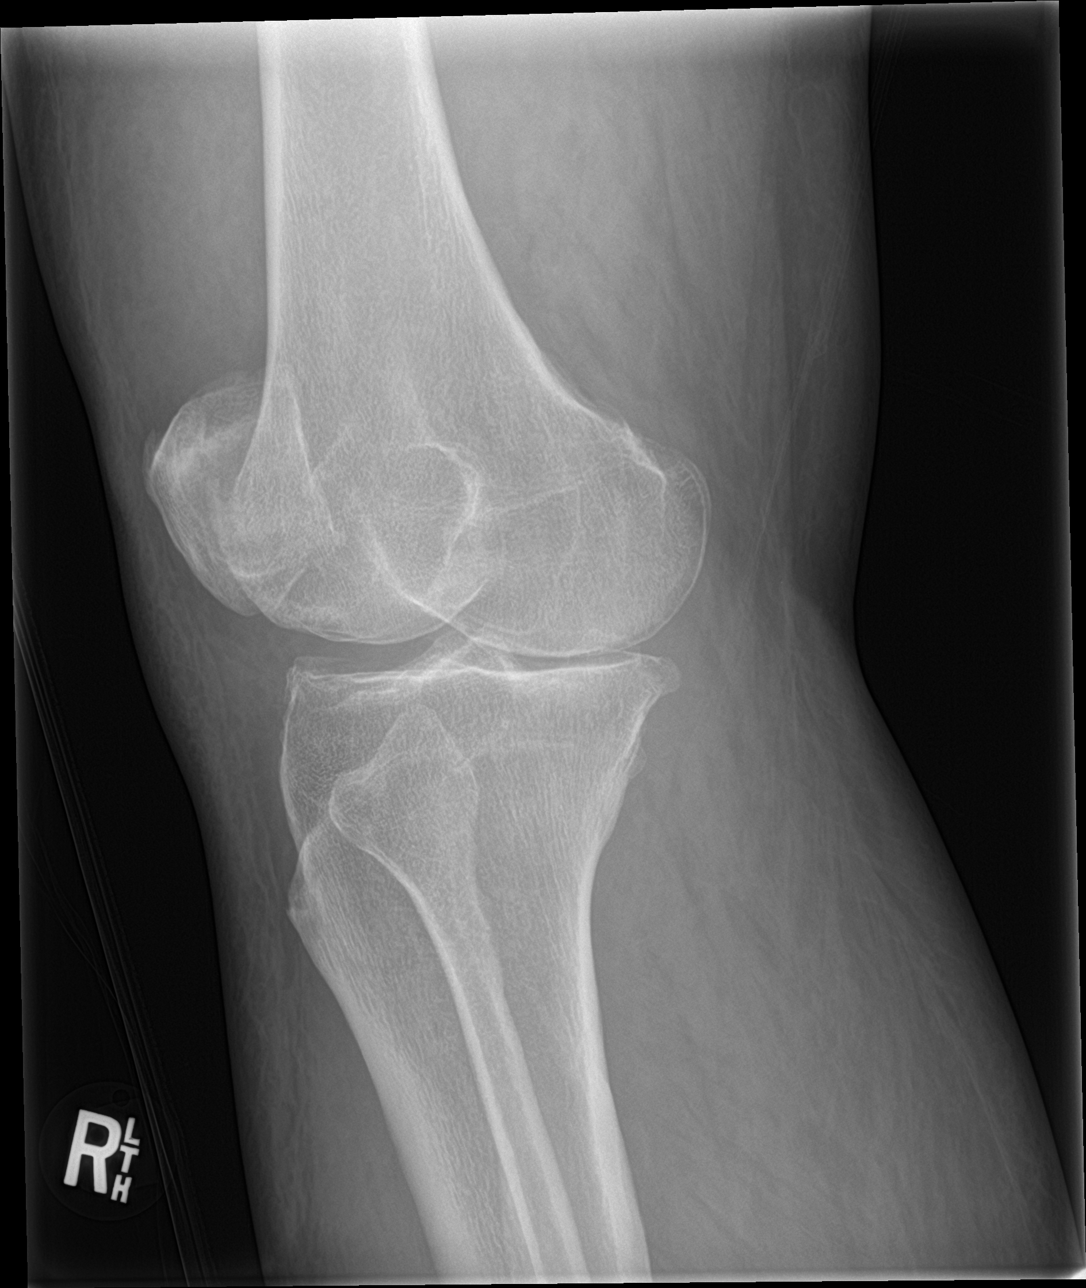

[knee ap (3 of 3)]
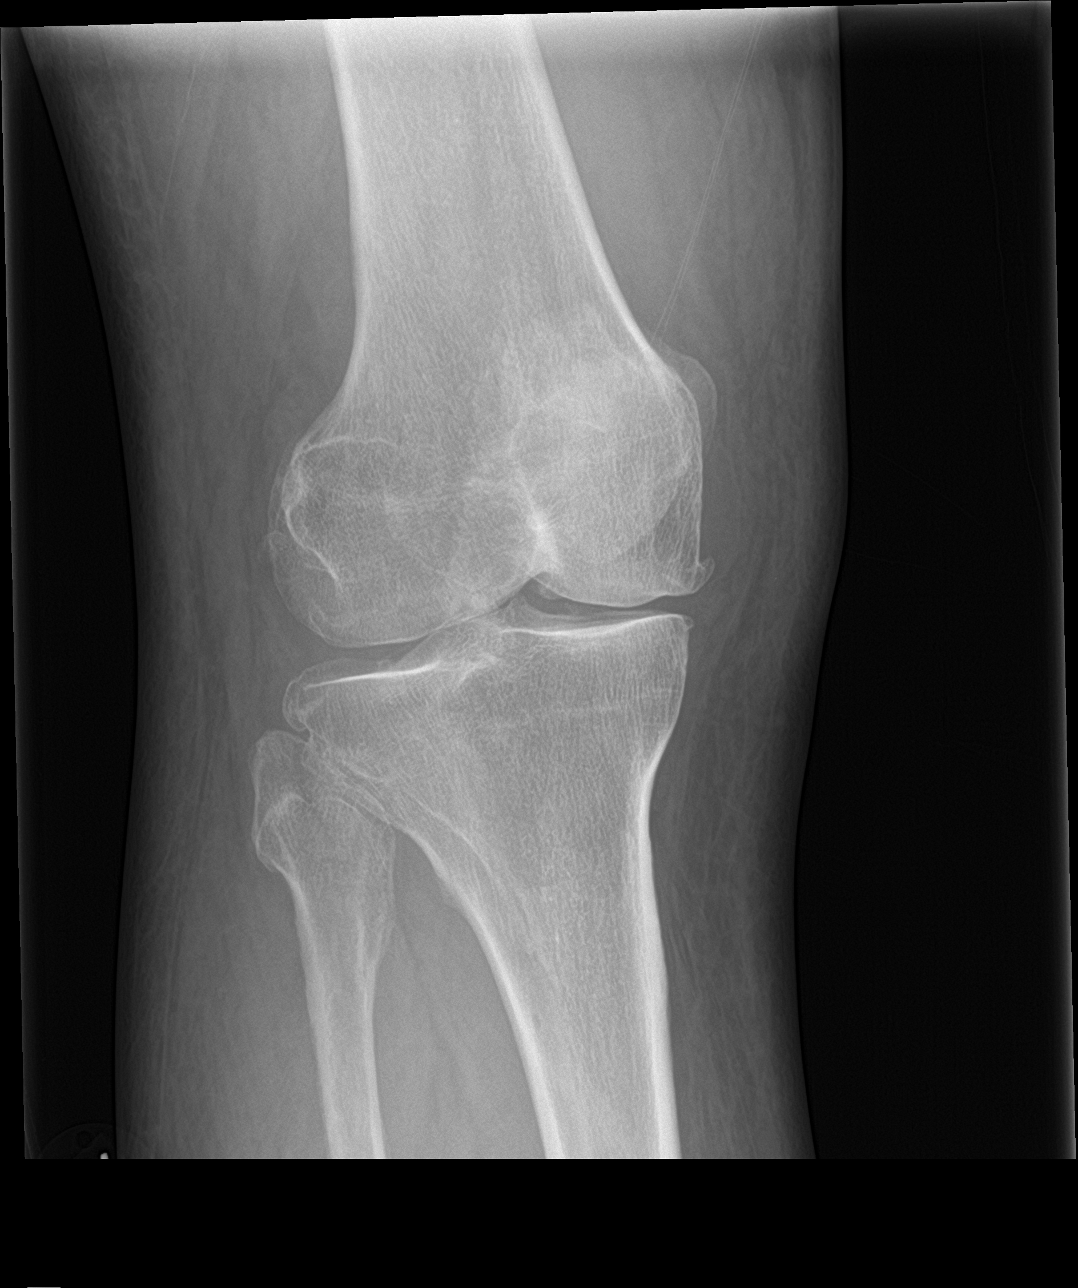

[4 of 4 positions shown; findings below may reference images not displayed]

FINDINGS: No fracture or subluxation. Medial tibiofemoral and patellofemoral
joint space narrowing. Tricompartmental peripheral spurring. No bony
destructive change. There is a moderate joint effusion. Mild soft
tissue edema.
IMPRESSION: Moderate tricompartmental osteoarthritis. Moderate joint effusion.
No acute bony abnormality.

## 2019-04-20 LAB — NOVEL CORONAVIRUS, NAA: SARS-CoV-2, NAA: DETECTED — AB

## 2019-04-21 ENCOUNTER — Telehealth: Payer: Self-pay | Admitting: Infectious Diseases

## 2019-04-21 ENCOUNTER — Encounter: Payer: Self-pay | Admitting: Infectious Diseases

## 2019-04-21 NOTE — Telephone Encounter (Signed)
Called to discuss with patient about Covid symptoms and the use of bamlanivimab, a monoclonal antibody infusion for those with mild to moderate Covid symptoms and at a high risk of hospitalization.  He has lyme disease which has affected his joints - takes celebrex regularly for this and has continued to do so. No fevers.   The fatigue has been present for 7 days now; did have "cold symptoms" earlier on but those have resolved and he feels overall much better. His wife is sick with similar mild symptoms and likely will arrange for testing Monday.   Precautions discussed.

## 2019-04-24 DIAGNOSIS — U071 COVID-19: Secondary | ICD-10-CM | POA: Diagnosis not present

## 2019-04-24 DIAGNOSIS — J22 Unspecified acute lower respiratory infection: Secondary | ICD-10-CM | POA: Diagnosis not present

## 2019-04-29 DIAGNOSIS — I10 Essential (primary) hypertension: Secondary | ICD-10-CM | POA: Diagnosis not present

## 2019-04-29 DIAGNOSIS — E7849 Other hyperlipidemia: Secondary | ICD-10-CM | POA: Diagnosis not present

## 2019-04-29 DIAGNOSIS — M19042 Primary osteoarthritis, left hand: Secondary | ICD-10-CM | POA: Diagnosis not present

## 2019-05-23 DIAGNOSIS — M546 Pain in thoracic spine: Secondary | ICD-10-CM | POA: Diagnosis not present

## 2019-05-23 DIAGNOSIS — M9905 Segmental and somatic dysfunction of pelvic region: Secondary | ICD-10-CM | POA: Diagnosis not present

## 2019-05-23 DIAGNOSIS — M9902 Segmental and somatic dysfunction of thoracic region: Secondary | ICD-10-CM | POA: Diagnosis not present

## 2019-05-23 DIAGNOSIS — M545 Low back pain: Secondary | ICD-10-CM | POA: Diagnosis not present

## 2019-05-23 DIAGNOSIS — M9903 Segmental and somatic dysfunction of lumbar region: Secondary | ICD-10-CM | POA: Diagnosis not present

## 2019-06-11 ENCOUNTER — Ambulatory Visit (INDEPENDENT_AMBULATORY_CARE_PROVIDER_SITE_OTHER): Payer: Medicare Other | Admitting: Podiatry

## 2019-06-11 ENCOUNTER — Ambulatory Visit (INDEPENDENT_AMBULATORY_CARE_PROVIDER_SITE_OTHER): Payer: Medicare Other

## 2019-06-11 ENCOUNTER — Other Ambulatory Visit: Payer: Self-pay

## 2019-06-11 ENCOUNTER — Other Ambulatory Visit: Payer: Self-pay | Admitting: Podiatry

## 2019-06-11 VITALS — Temp 96.3°F

## 2019-06-11 DIAGNOSIS — G5762 Lesion of plantar nerve, left lower limb: Secondary | ICD-10-CM | POA: Diagnosis not present

## 2019-06-11 DIAGNOSIS — M79672 Pain in left foot: Secondary | ICD-10-CM

## 2019-06-13 NOTE — Progress Notes (Signed)
   HPI: 69 y.o. male presenting today as a new patient with a chief complaint of intermittent aching and numbness of the left plantar forefoot that has been ongoing for the past 5-6 years. He denies any known modifying factors. He has not done anything for treatment. Patient is here for further evaluation and treatment.   Past Medical History:  Diagnosis Date  . Diabetes mellitus without complication ([place])   . Hyperlipidemia   . Hypertension       Physical Exam: General: The patient is alert and oriented x3 in no acute distress.  Dermatology: Hyperkeratotic, discolored, thickened, onychodystrophy noted to the left 2nd toenail. Skin is warm, dry and supple bilateral lower extremities. Negative for open lesions or macerations.  Vascular: Palpable pedal pulses bilaterally. No edema or erythema noted. Capillary refill within normal limits.  Neurological: Epicritic and protective threshold grossly intact bilaterally.   Musculoskeletal Exam: Sharp pain with palpation of the left 3rd interspace and lateral compression of the metatarsal heads consistent with neuroma.  Positive [place] sign with loadbearing of the forefoot.  Radiographic Exam:  Normal osseous mineralization. Joint spaces preserved. No fracture/dislocation/boney destruction.    Assessment: 1.  Morton's neuroma 3rd interspace left foot 2. Onychomycosis left 2nd toenail    Plan of Care:  1. Patient was evaluated. X-Rays reviewed.  2. Appointment with Pedorthist for custom molded orthotics.  3. Recommended wide fitting shoes.  4. Declined injection today.  5. OTC Tolcylen antifungal lacquer provided to patient.  6. Return to clinic as needed.    Edrick Kins, DPM [facility]  Dr. Edrick Kins, [address]                                        [place], [place] [address]                Office [phone]  Fax [phone]

## 2019-06-15 ENCOUNTER — Ambulatory Visit: Payer: Medicare Other | Admitting: Orthotics

## 2019-06-15 ENCOUNTER — Other Ambulatory Visit: Payer: Self-pay

## 2019-06-15 DIAGNOSIS — G5762 Lesion of plantar nerve, left lower limb: Secondary | ICD-10-CM

## 2019-06-15 NOTE — Progress Notes (Signed)
Patient evaluate for f/o Morton's neurma 3/4 left.   Goal is offloading neurma by divot and pad proximal to offload.  No charge per dr. Amalia Hailey.

## 2019-06-27 DIAGNOSIS — M19042 Primary osteoarthritis, left hand: Secondary | ICD-10-CM | POA: Diagnosis not present

## 2019-06-27 DIAGNOSIS — I10 Essential (primary) hypertension: Secondary | ICD-10-CM | POA: Diagnosis not present

## 2019-06-27 DIAGNOSIS — E7849 Other hyperlipidemia: Secondary | ICD-10-CM | POA: Diagnosis not present

## 2019-07-09 ENCOUNTER — Other Ambulatory Visit: Payer: Self-pay

## 2019-07-09 ENCOUNTER — Ambulatory Visit: Payer: Medicare Other | Admitting: Orthotics

## 2019-07-09 DIAGNOSIS — G5762 Lesion of plantar nerve, left lower limb: Secondary | ICD-10-CM

## 2019-07-09 DIAGNOSIS — M79672 Pain in left foot: Secondary | ICD-10-CM

## 2019-07-09 NOTE — Progress Notes (Signed)
Patient came in today to pick up custom made foot orthotics.  The goals were accomplished and the patient reported no dissatisfaction with said orthotics.  Patient was advised of breakin period and how to report any issues. 

## 2019-07-13 DIAGNOSIS — M9903 Segmental and somatic dysfunction of lumbar region: Secondary | ICD-10-CM | POA: Diagnosis not present

## 2019-07-13 DIAGNOSIS — M545 Low back pain: Secondary | ICD-10-CM | POA: Diagnosis not present

## 2019-07-13 DIAGNOSIS — M9902 Segmental and somatic dysfunction of thoracic region: Secondary | ICD-10-CM | POA: Diagnosis not present

## 2019-07-13 DIAGNOSIS — M9905 Segmental and somatic dysfunction of pelvic region: Secondary | ICD-10-CM | POA: Diagnosis not present

## 2019-07-13 DIAGNOSIS — M546 Pain in thoracic spine: Secondary | ICD-10-CM | POA: Diagnosis not present

## 2019-07-18 DIAGNOSIS — M533 Sacrococcygeal disorders, not elsewhere classified: Secondary | ICD-10-CM | POA: Diagnosis not present

## 2019-07-23 DIAGNOSIS — E785 Hyperlipidemia, unspecified: Secondary | ICD-10-CM | POA: Diagnosis not present

## 2019-07-23 DIAGNOSIS — R079 Chest pain, unspecified: Secondary | ICD-10-CM | POA: Diagnosis not present

## 2019-07-23 DIAGNOSIS — E119 Type 2 diabetes mellitus without complications: Secondary | ICD-10-CM | POA: Diagnosis not present

## 2019-07-23 DIAGNOSIS — I10 Essential (primary) hypertension: Secondary | ICD-10-CM | POA: Diagnosis not present

## 2019-07-23 DIAGNOSIS — R0789 Other chest pain: Secondary | ICD-10-CM | POA: Diagnosis not present

## 2019-07-23 DIAGNOSIS — Z743 Need for continuous supervision: Secondary | ICD-10-CM | POA: Diagnosis not present

## 2019-07-30 DIAGNOSIS — M545 Low back pain: Secondary | ICD-10-CM | POA: Diagnosis not present

## 2019-07-30 DIAGNOSIS — M9905 Segmental and somatic dysfunction of pelvic region: Secondary | ICD-10-CM | POA: Diagnosis not present

## 2019-07-30 DIAGNOSIS — M9903 Segmental and somatic dysfunction of lumbar region: Secondary | ICD-10-CM | POA: Diagnosis not present

## 2019-07-30 DIAGNOSIS — M9902 Segmental and somatic dysfunction of thoracic region: Secondary | ICD-10-CM | POA: Diagnosis not present

## 2019-07-30 DIAGNOSIS — M546 Pain in thoracic spine: Secondary | ICD-10-CM | POA: Diagnosis not present

## 2019-07-31 DIAGNOSIS — E782 Mixed hyperlipidemia: Secondary | ICD-10-CM | POA: Diagnosis not present

## 2019-07-31 DIAGNOSIS — Z0000 Encounter for general adult medical examination without abnormal findings: Secondary | ICD-10-CM | POA: Diagnosis not present

## 2019-07-31 DIAGNOSIS — Z1389 Encounter for screening for other disorder: Secondary | ICD-10-CM | POA: Diagnosis not present

## 2019-07-31 DIAGNOSIS — I10 Essential (primary) hypertension: Secondary | ICD-10-CM | POA: Diagnosis not present

## 2019-07-31 DIAGNOSIS — R7309 Other abnormal glucose: Secondary | ICD-10-CM | POA: Diagnosis not present

## 2019-07-31 DIAGNOSIS — E7849 Other hyperlipidemia: Secondary | ICD-10-CM | POA: Diagnosis not present

## 2019-08-02 NOTE — Progress Notes (Signed)
[facility] Note  08/06/2019     CHIEF COMPLAINT Patient presents for Retina Follow Up   HISTORY OF PRESENT ILLNESS: Malik Bowen is a 69 y.o. male who presents to the clinic today for:   HPI    Retina Follow Up    Patient presents with  Diabetic Retinopathy.  In both eyes.  This started 10 years ago.  Severity is mild.  Duration of 1 year.  I, the attending physician,  performed the HPI with the patient and updated documentation appropriately.          Comments    Patient denies any new problems with vision or eyes.  Vision stable OU since last visit. A1C: 6 LBS: unsure- he does not check; DM x10 yrs       Last edited by Bernarda Caffey, MD on 08/06/2019  8:37 AM. (History)    Pt is here for 1 year DM exam, he states his A1c is 6.0 (checked last week), he is still taking metformin, he says his arthritis is getting worse, he is still using the glasses he got 20 years ago after he had lasik   Referring physician: Chidi Munch, PA-C [place],  [place] [address]  HISTORICAL INFORMATION:   Selected notes from the [facility] referred for DM exam   CURRENT MEDICATIONS: No current outpatient medications on file. (Ophthalmic Drugs)   No current facility-administered medications for this visit. (Ophthalmic Drugs)   Current Outpatient Medications (Other)  Medication Sig  . atorvastatin (LIPITOR) 40 MG tablet Take 40 mg by mouth daily.  . celecoxib (CELEBREX) 200 MG capsule   . hydrochlorothiazide (HYDRODIURIL) 25 MG tablet Take 25 mg by mouth daily.  Marland Kitchen HYDROcodone-acetaminophen (NORCO/VICODIN) 5-325 MG tablet Take 1-2 tablets by mouth every 6 (six) hours as needed.  Marland Kitchen losartan (COZAAR) 100 MG tablet Take 100 mg by mouth daily.  . metFORMIN (GLUCOPHAGE) 500 MG tablet Take 500 mg by mouth daily with breakfast.   . tadalafil (CIALIS) 20 MG tablet Take 20 mg by mouth daily as needed.    No current  facility-administered medications for this visit. (Other)      REVIEW OF SYSTEMS: ROS    Positive for: Musculoskeletal, Endocrine, Eyes   Negative for: Constitutional, Gastrointestinal, Neurological, Skin, Genitourinary, HENT, Cardiovascular, Respiratory, Psychiatric, Allergic/Imm, Heme/Lymph   Last edited by Leonie Douglas, COA on 08/06/2019  8:07 AM. (History)       ALLERGIES Allergies  Allergen Reactions  . Other     Walnuts and strawberrys cause mouth sores    PAST MEDICAL HISTORY Past Medical History:  Diagnosis Date  . Diabetes mellitus without complication ([place])   . Hyperlipidemia   . Hypertension    Past Surgical History:  Procedure Laterality Date  . EYE SURGERY    . LASIK      FAMILY HISTORY Family History  Problem Relation Age of Onset  . Heart failure Father   . Heart failure Brother     SOCIAL HISTORY Social History   Tobacco Use  . Smoking status: Never Smoker  . Smokeless tobacco: Never Used  Substance Use Topics  . Alcohol use: No  . Drug use: No         OPHTHALMIC EXAM:  Base Eye Exam    Visual Acuity (Snellen - Linear)      Right Left   Dist  20/25 20/20       Tonometry (Tonopen, 8:13 AM)  Right Left   Pressure 14 17       Pupils      Dark Light Shape React APD   Right 3 2 Round Brisk None   Left 3 2 Round Brisk None       Visual Fields      Left Right    Full Full       Extraocular Movement      Right Left    Full Full       Neuro/Psych    Oriented x3: Yes   Mood/Affect: Normal       Dilation    Both eyes: 1.0% Mydriacyl, 2.5% Phenylephrine @ 8:12 AM        Slit Lamp and Fundus Exam    Slit Lamp Exam      Right Left   Lids/Lashes Dermatochalasis - upper lid, mild Meibomian gland dysfunction Dermatochalasis - upper lid, Meibomian gland dysfunction   Conjunctiva/Sclera White and quiet White and quiet   Cornea Arcus, trace Punctate epithelial erosions, barely visible lasik flap Arcus, trace Punctate  epithelial erosions, barely visible lasik flap   Anterior Chamber Deep and quiet Deep and quiet   Iris Round and dilated, No NVI Round and dilated, No NVI   Lens 2+ Nuclear sclerosis, 2+ Cortical cataract 2-3+ Nuclear sclerosis, 2+ Cortical cataract   Vitreous Vitreous syneresis, Posterior vitreous detachment, vitreous condesations inferiorly Vitreous syneresis, Posterior vitreous detachment, scattered vitreous condensations        Fundus Exam      Right Left   Disc pink and sharp, Tilted disc, Temporal Peripapillary atrophy pink and sharp, Tilted disc, Temporal Peripapillary atrophy and pigment   C/D Ratio 0.4 0.4   Macula Flat, blunted foveal reflex, mild Retinal pigment epithelial mottling, No heme or edema Flat, blunted foveal reflex, mild Retinal pigment epithelial mottling, No heme or edema   Vessels Mild Vascular attenuation Mild Vascular attenuation   Periphery Attached, mild cobblestoning inferiorly, mild pigmented Chorioretinal scar at 0730, no heme Attached, mild peripheral CR atrophy temporally, no heme          IMAGING AND PROCEDURES  Imaging and Procedures for @TODAY @  OCT, Retina - OU - Both Eyes       Right Eye Quality was good. Central Foveal Thickness: 291. Progression has been stable. Findings include normal foveal contour, no IRF, no SRF, epiretinal membrane (Mild ERM superior macula).   Left Eye Quality was good. Central Foveal Thickness: 281. Progression has been stable. Findings include normal foveal contour, no IRF, no SRF (Trace ERM superior macula).   Notes *Images captured and stored on drive  Diagnosis / Impression:  No DME OU  Clinical management:  See below  Abbreviations: NFP - Normal foveal profile. CME - cystoid macular edema. PED - pigment epithelial detachment. IRF - intraretinal fluid. SRF - subretinal fluid. EZ - ellipsoid zone. ERM - epiretinal membrane. ORA - outer retinal atrophy. ORT - outer retinal tubulation. SRHM - subretinal  hyper-reflective material                 ASSESSMENT/PLAN:    ICD-10-CM   1. Diabetes mellitus type 2 without retinopathy ([place])  E11.9   2. Retinal edema  H35.81 OCT, Retina - OU - Both Eyes  3. Posterior vitreous detachment of both eyes  H43.813   4. Hx of LASIK  Z98.890   5. Combined forms of age-related cataract of both eyes  H25.813     1. Diabetes mellitus, type 2 without retinopathy  -  stable from prior  - The incidence, risk factors for progression, natural history and treatment options for diabetic retinopathy  were discussed with patient.    - The need for close monitoring of blood glucose, blood pressure, and serum lipids, avoiding cigarette or any type of tobacco, and the need for long term follow up was also discussed with patient.  - f/u in 1 year, sooner prn  2. PVD / vitreous syneresis  - fairly prominent vitreous condensations  - stable   - Discussed findings and prognosis  - No RT or RD on 360 peripheral exam  - Reviewed s/s of RT/RD  - Strict return precautions for any such RT/RD signs/symptoms  3. No retinal edema on exam or OCT  4. Hx of Lasik OU-   - doing well  - monitor  5. Combined forma age-related cataracts OU-   - The symptoms of cataract, surgical options, and treatments and risks were discussed with patient.  - discussed diagnosis and progression  - not yet visually significant  - monitor for now   Ophthalmic Meds Ordered this visit:  No orders of the defined types were placed in this encounter.      Return in about 1 year (around 08/05/2020) for f/u DM exam, DFE, OCT.  There are no Patient Instructions on file for this visit.   Explained the diagnoses, plan, and follow up with the patient and they expressed understanding.  Patient expressed understanding of the importance of proper follow up care.   This document serves as a record of services personally performed by Gardiner Sleeper, MD, PhD. It was created on their behalf by  Leeann Must, [place], a certified ophthalmic assistant. The creation of this record is the provider's dictation and/or activities during the visit.    Electronically signed by: Leeann Must, COA @TODAY @ 12:58 PM   This document serves as a record of services personally performed by Gardiner Sleeper, MD, PhD. It was created on their behalf by Ernest Mallick, OA, an ophthalmic assistant. The creation of this record is the provider's dictation and/or activities during the visit.    Electronically signed by: Ernest Mallick, OA 05.10.2021 12:58 PM   Gardiner Sleeper, M.D., Ph.D. Diseases & Surgery of the Retina and Vitreous [facility]  I have reviewed the above documentation for accuracy and completeness, and I agree with the above. Gardiner Sleeper, M.D., Ph.D. 08/06/19 12:58 PM   Abbreviations: M myopia (nearsighted); A astigmatism; H hyperopia (farsighted); P presbyopia; Mrx spectacle prescription;  CTL contact lenses; OD right eye; OS left eye; OU both eyes  XT exotropia; ET esotropia; PEK punctate epithelial keratitis; PEE punctate epithelial erosions; DES dry eye syndrome; MGD meibomian gland dysfunction; ATs artificial tears; PFAT's preservative free artificial tears; [place] nuclear sclerotic cataract; PSC posterior subcapsular cataract; ERM epi-retinal membrane; PVD posterior vitreous detachment; RD retinal detachment; DM diabetes mellitus; DR diabetic retinopathy; NPDR non-proliferative diabetic retinopathy; PDR proliferative diabetic retinopathy; CSME clinically significant macular edema; DME diabetic macular edema; dbh dot blot hemorrhages; CWS cotton wool spot; POAG primary open angle glaucoma; C/D cup-to-disc ratio; HVF humphrey visual field; GVF goldmann visual field; OCT optical coherence tomography; IOP intraocular pressure; BRVO Branch retinal vein occlusion; CRVO central retinal vein occlusion; CRAO central retinal artery occlusion; BRAO branch retinal artery occlusion;  RT retinal tear; SB scleral buckle; PPV pars plana vitrectomy; VH Vitreous hemorrhage; PRP panretinal laser photocoagulation; IVK intravitreal kenalog; VMT vitreomacular traction; MH Macular hole;  NVD neovascularization of  the disc; NVE neovascularization elsewhere; AREDS age related eye disease study; ARMD age related macular degeneration; POAG primary open angle glaucoma; EBMD epithelial/anterior basement membrane dystrophy; ACIOL anterior chamber intraocular lens; IOL intraocular lens; PCIOL posterior chamber intraocular lens; Phaco/IOL phacoemulsification with intraocular lens placement; [place] photorefractive keratectomy; LASIK laser assisted in situ keratomileusis; HTN hypertension; DM diabetes mellitus; COPD chronic obstructive pulmonary disease

## 2019-08-06 ENCOUNTER — Other Ambulatory Visit: Payer: Self-pay

## 2019-08-06 ENCOUNTER — Encounter (INDEPENDENT_AMBULATORY_CARE_PROVIDER_SITE_OTHER): Payer: Self-pay | Admitting: Ophthalmology

## 2019-08-06 ENCOUNTER — Ambulatory Visit (INDEPENDENT_AMBULATORY_CARE_PROVIDER_SITE_OTHER): Payer: Medicare Other | Admitting: Ophthalmology

## 2019-08-06 DIAGNOSIS — Z9889 Other specified postprocedural states: Secondary | ICD-10-CM | POA: Diagnosis not present

## 2019-08-06 DIAGNOSIS — E119 Type 2 diabetes mellitus without complications: Secondary | ICD-10-CM | POA: Diagnosis not present

## 2019-08-06 DIAGNOSIS — H3581 Retinal edema: Secondary | ICD-10-CM

## 2019-08-06 DIAGNOSIS — H43813 Vitreous degeneration, bilateral: Secondary | ICD-10-CM | POA: Diagnosis not present

## 2019-08-06 DIAGNOSIS — H25813 Combined forms of age-related cataract, bilateral: Secondary | ICD-10-CM | POA: Diagnosis not present

## 2019-08-07 ENCOUNTER — Telehealth: Payer: Self-pay | Admitting: General Practice

## 2019-08-07 NOTE — Telephone Encounter (Signed)
Patient calling about scheduling a new patient appt. Advised to have a referral sent over so we can schedule.

## 2019-08-08 IMAGING — DX DG WRIST COMPLETE 3+V*R*
3 series · 3 of 3 positions shown · non-contrast
Comparison: None.

CLINICAL DATA: Right wrist pain, atraumatic

EXAM:
RIGHT WRIST - COMPLETE 3+ VIEW

[wrist pa]
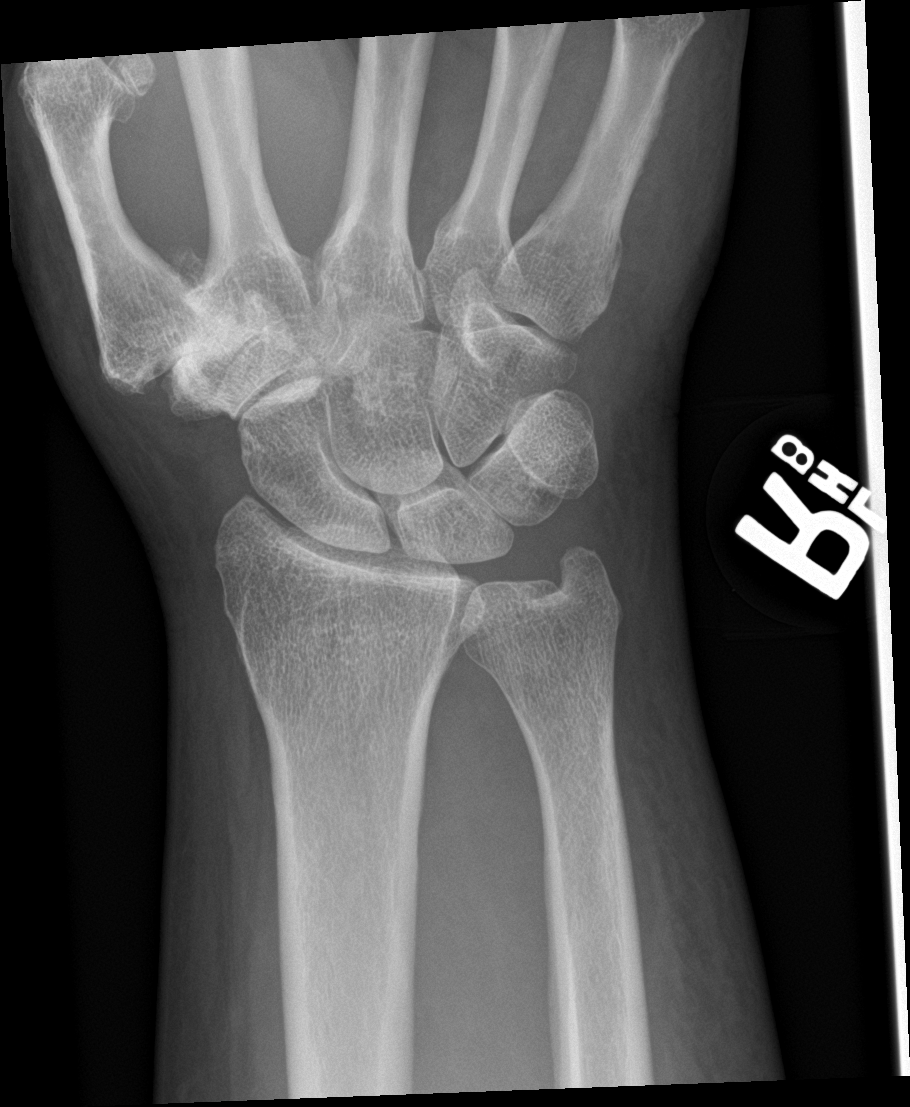

[wrist obl]
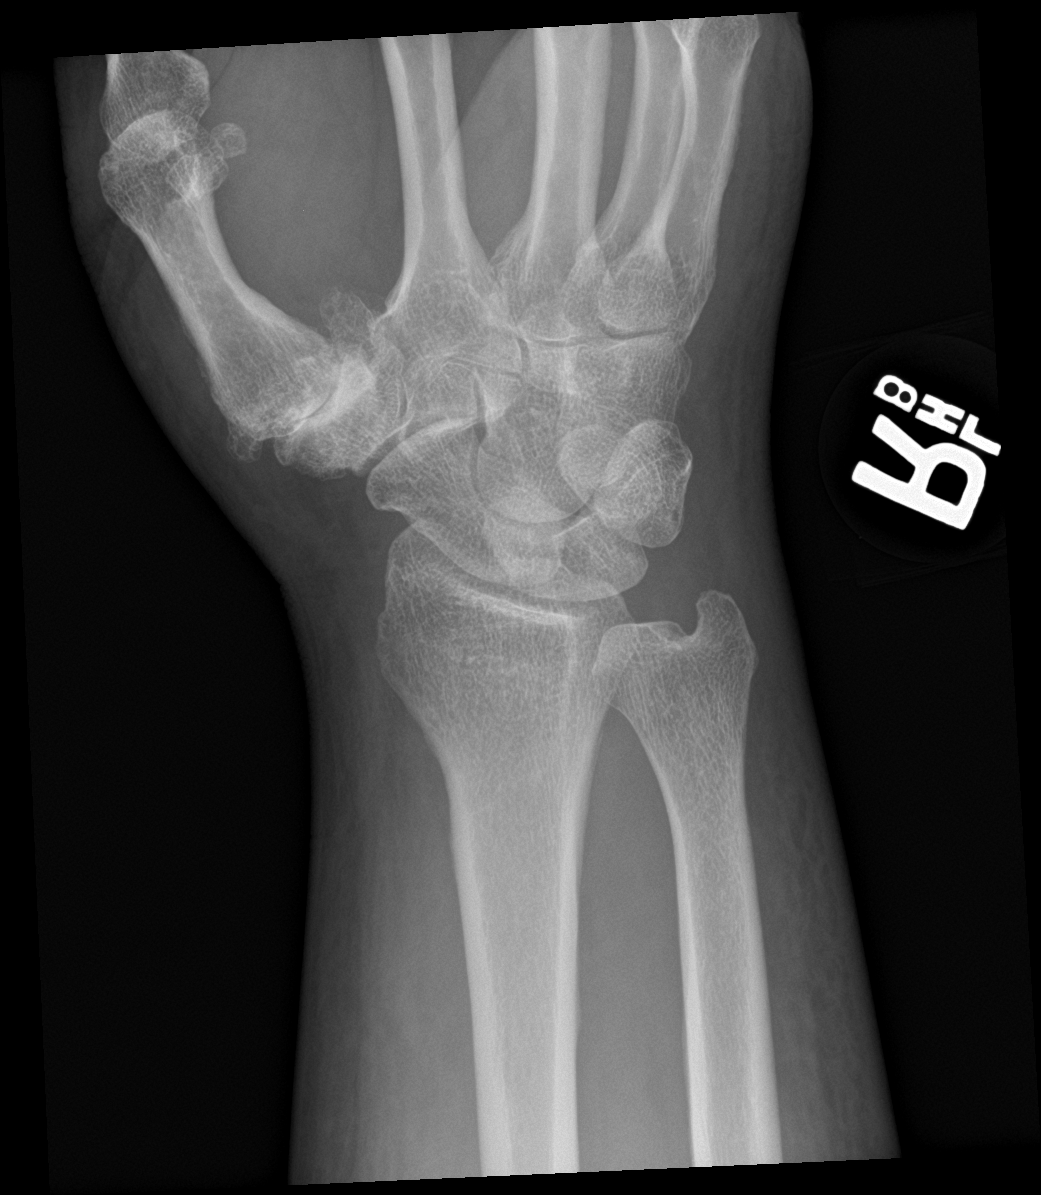

[wrist lat]
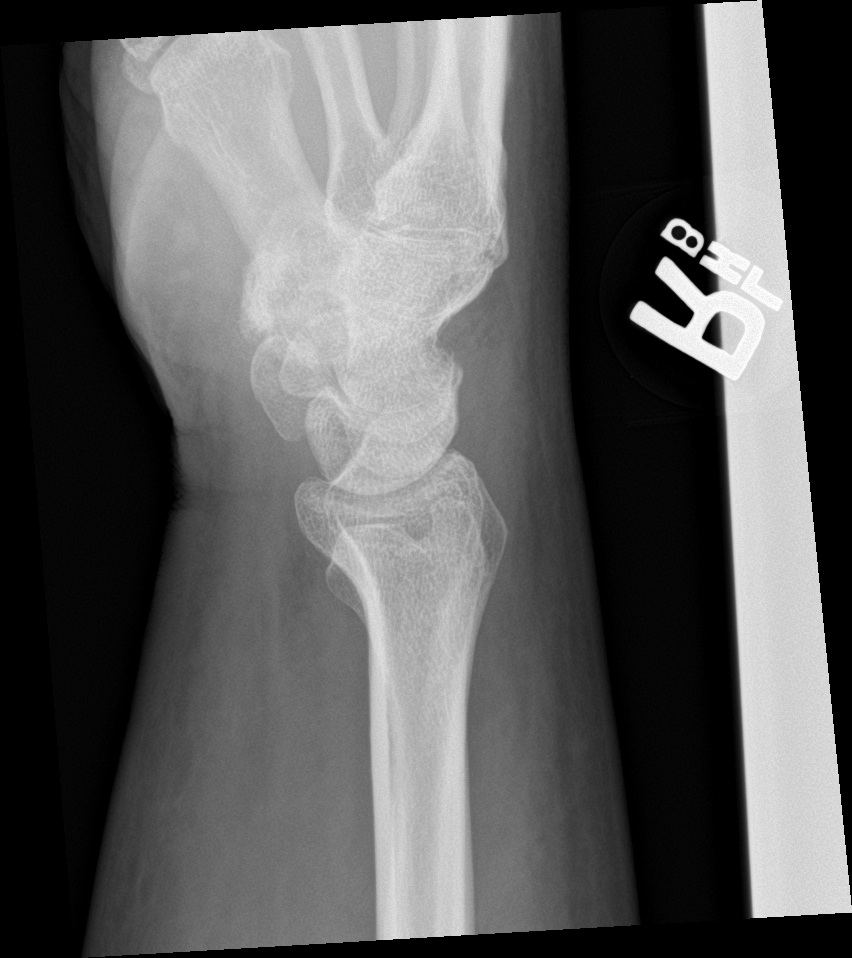

[3 of 3 positions shown; findings below may reference images not displayed]

FINDINGS: Negative for fracture or malalignment. Advanced first CMC
osteoarthritis with bone-on-bone contact, spurring, and mild
subluxation. Nonspecific subcutaneous reticulation in the distal
forearm. No soft tissue calcification. No visible erosion.
IMPRESSION: 1. Subcutaneous reticulation/edema in the distal forearm.
2. No acute osseous finding.
3. Advanced first CMC osteoarthritis.

## 2019-08-16 ENCOUNTER — Encounter (INDEPENDENT_AMBULATORY_CARE_PROVIDER_SITE_OTHER): Payer: Medicare Other | Admitting: Ophthalmology

## 2019-08-27 DIAGNOSIS — I10 Essential (primary) hypertension: Secondary | ICD-10-CM | POA: Diagnosis not present

## 2019-08-27 DIAGNOSIS — R7309 Other abnormal glucose: Secondary | ICD-10-CM | POA: Diagnosis not present

## 2019-08-27 DIAGNOSIS — M19042 Primary osteoarthritis, left hand: Secondary | ICD-10-CM | POA: Diagnosis not present

## 2019-08-27 DIAGNOSIS — E7849 Other hyperlipidemia: Secondary | ICD-10-CM | POA: Diagnosis not present

## 2019-09-14 ENCOUNTER — Other Ambulatory Visit (HOSPITAL_COMMUNITY)
Admission: RE | Admit: 2019-09-14 | Discharge: 2019-09-14 | Disposition: A | Discharge status: Home / Self Care | Payer: Medicare Other | Source: Ambulatory Visit | Attending: Internal Medicine | Admitting: Internal Medicine

## 2019-09-14 ENCOUNTER — Ambulatory Visit: Payer: Medicare Other | Admitting: Internal Medicine

## 2019-09-14 ENCOUNTER — Encounter: Payer: Self-pay | Admitting: Internal Medicine

## 2019-09-14 ENCOUNTER — Other Ambulatory Visit: Payer: Self-pay

## 2019-09-14 VITALS — BP 110/66 | HR 66 | Ht 71.0 in | Wt 249.0 lb

## 2019-09-14 DIAGNOSIS — I10 Essential (primary) hypertension: Secondary | ICD-10-CM

## 2019-09-14 DIAGNOSIS — I259 Chronic ischemic heart disease, unspecified: Secondary | ICD-10-CM | POA: Diagnosis not present

## 2019-09-14 DIAGNOSIS — I209 Angina pectoris, unspecified: Secondary | ICD-10-CM

## 2019-09-14 LAB — BASIC METABOLIC PANEL
Anion gap: 12 (ref 5–15)
BUN: 28 mg/dL — ABNORMAL HIGH (ref 8–23)
CO2: 25 mmol/L (ref 22–32)
Calcium: 9.4 mg/dL (ref 8.9–10.3)
Chloride: 103 mmol/L (ref 98–111)
Creatinine, Ser: 1.19 mg/dL (ref 0.61–1.24)
GFR calc Af Amer: >60 mL/min (ref >60)
GFR calc non Af Amer: >60 mL/min (ref >60)
Glucose, Bld: 122 mg/dL — ABNORMAL HIGH (ref 70–99)
Potassium: 4 mmol/L (ref 3.5–5.1)
Sodium: 140 mmol/L (ref 135–145)

## 2019-09-14 MED ORDER — METOPROLOL TARTRATE 100 MG PO TABS
ORAL_TABLET | ORAL | 0 refills | Status: DC
Start: 2019-09-14 — End: 2020-05-28

## 2019-09-14 MED ORDER — ASPIRIN EC 81 MG PO TBEC: 81.0000 mg | DELAYED_RELEASE_TABLET | Freq: Every day | ORAL | 3 refills | Status: DC

## 2019-09-14 NOTE — Progress Notes (Signed)
Cardiology Office Note   Date:  09/14/2019   ID:  Malik Bowen, DOB November 09, 1950, MRN [id number]  PCP:  Malik Munch, PA-C  Cardiologist:   Malik Carnes, MD    Pt referred from [facility] for evaluation   History of Present Illness: Malik Bowen is a 69 y.o. male with a history of HTN and HL (LDL in May 2021 77)  Pt referred to cardiology for a stress test      Going on trip to [place]   Did telecall with MD   Had discomfort in mid chest    Went to ED   Ambulance 205/110   NTG  Went to ED in [place]   R/O for [place] out with plans for follow up with MD  The patient says while he was  on vacation he biked without a problem    Came home   Seen by Dr Malik Bowen      The pt walks 12-13,000 steps per day  Mows lawns  Clears trees    Goes to [facility] (treadmill at 3 miles / hr)   He says though when he exerts himselft at times he gets pain in mid chest  Chiropracter said pain was musculoskeletal   But, he denies pain with movement of chest or deep breath   Still intermitt     Yesterday mowing hillside with handmower He felt pain in chest   Kept moving  No SOB     No pain now       Current Meds  Medication Sig  . atorvastatin (LIPITOR) 40 MG tablet Take 40 mg by mouth daily.  . celecoxib (CELEBREX) 200 MG capsule Take 200 mg by mouth as needed.   . hydrochlorothiazide (HYDRODIURIL) 25 MG tablet Take 25 mg by mouth daily.  Marland Kitchen HYDROcodone-acetaminophen (NORCO/VICODIN) 5-325 MG tablet Take 1-2 tablets by mouth every 6 (six) hours as needed.  . metFORMIN (GLUCOPHAGE) 500 MG tablet Take 500 mg by mouth daily with breakfast.   . olmesartan (BENICAR) 40 MG tablet Take 40 mg by mouth daily.  . tadalafil (CIALIS) 20 MG tablet Take 20 mg by mouth daily as needed.      Allergies:   Other   Past Medical History:  Diagnosis Date  . Diabetes mellitus without complication (Malik Bowen)   . Hyperlipidemia   . Hypertension     Past Surgical History:  Procedure Laterality Date  . EYE  SURGERY    . LASIK       Social History:  The patient  reports that he has never smoked. He has never used smokeless tobacco. He reports that he does not drink alcohol and does not use drugs.   Family History:  The patient's family history includes Heart failure in his brother and father.    ROS:  Please see the history of present illness. All other systems are reviewed and  Negative to the above problem except as noted.    PHYSICAL EXAM: VS:  BP 110/66   Pulse 66   Ht 5\' 11"  (1.803 m)   Wt 249 lb (112.9 kg)   SpO2 96%   BMI 34.73 kg/m   GEN:  Obese 69 yo in no acute distress  HEENT: normal  Neck: no JVD, carotid bruits Cardiac: RRR; no murmurs, rubs, or gallops,no  LE edema  Respiratory:  clear to auscultation bilaterally, normal work of breathing GI: soft, nontender,distended, + BS  No  hepatomegaly  MS: no deformity Moving all extremities   Skin: warm and dry, no rash Neuro:  Strength and sensation are intact Psych: euthymic mood, full affect   EKG:  EKG is ordered today.  SR 66 bpm     Lipid Panel No results found for: CHOL, TRIG, HDL, CHOLHDL, VLDL, LDLCALC, LDLDIRECT    Wt Readings from Last 3 Encounters:  09/14/19 249 lb (112.9 kg)  03/12/17 236 lb (107 kg)      ASSESSMENT AND PLAN:  1  Chest pain    The pain is atypical in some parts, typica in others  Concerning     I would recomm a coronary CT angiogram to evaluate    Try to expedite    Take 81 mg ASA daily  Take activites as tolerated   Stop for pain   2  HTN   BP is OK     3  HLContinue statin   LDL will need to be followed    4  DM  Continue oral agent    Current medicines are reviewed at length with the patient today.  The patient does not have concerns regarding medicines.  Signed, Malik Carnes, MD  09/14/2019 9:01 AM    [address] [place], [place], [place]  [address] Phone: [phone]; Fax: [phone]

## 2019-09-14 NOTE — Patient Instructions (Signed)
Medication Instructions:  START Aspirin 81 mg daily   Pick up Lopressor 100 mg at your local pharmacy, take 2 hours before CT  *If you need a refill on your cardiac medications before your next appointment, please call your pharmacy*   Lab Work: BMET today If you have labs (blood work) drawn today and your tests are completely normal, you will receive your results only by: Marland Kitchen MyChart Message (if you have MyChart) OR . A paper copy in the mail If you have any lab test that is abnormal or we need to change your treatment, we will call you to review the results.   Testing/Procedures: Your physician has requested that you have cardiac CT. Cardiac computed tomography (CT) is a painless test that uses an x-ray machine to take clear, detailed pictures of your heart. For further information please visit [email or web address]. Please follow instruction sheet as given.     Follow-Up: At [facility], you and your health needs are our priority.  As part of our continuing mission to provide you with exceptional heart care, we have created designated Provider Care Teams.  These Care Teams include your primary Cardiologist (physician) and Advanced Practice Providers (APPs -  Physician Assistants and Nurse Practitioners) who all work together to provide you with the care you need, when you need it.  We recommend signing up for the patient portal called "MyChart".  Sign up information is provided on this After Visit Summary.  MyChart is used to connect with patients for Virtual Visits (Telemedicine).  Patients are able to view lab/test results, encounter notes, upcoming appointments, etc.  Non-urgent messages can be sent to your provider as well.   To learn more about what you can do with MyChart, go to [email or web address].    Your next appointment:  Follow up to be determined after CT      Thank you for choosing [facility] !

## 2019-09-21 DIAGNOSIS — M9903 Segmental and somatic dysfunction of lumbar region: Secondary | ICD-10-CM | POA: Diagnosis not present

## 2019-09-21 DIAGNOSIS — M9902 Segmental and somatic dysfunction of thoracic region: Secondary | ICD-10-CM | POA: Diagnosis not present

## 2019-09-21 DIAGNOSIS — M9905 Segmental and somatic dysfunction of pelvic region: Secondary | ICD-10-CM | POA: Diagnosis not present

## 2019-09-21 DIAGNOSIS — M5441 Lumbago with sciatica, right side: Secondary | ICD-10-CM | POA: Diagnosis not present

## 2019-09-21 DIAGNOSIS — M546 Pain in thoracic spine: Secondary | ICD-10-CM | POA: Diagnosis not present

## 2019-10-02 ENCOUNTER — Telehealth (HOSPITAL_COMMUNITY): Payer: Self-pay | Admitting: *Deleted

## 2019-10-02 NOTE — Telephone Encounter (Signed)
Reaching out to patient to offer assistance regarding upcoming cardiac imaging study; pt verbalizes understanding of appt date/time, parking situation and where to check in, pre-test NPO status and medications ordered, and verified current allergies; name and call back number provided for further questions should they arise ° °Rhodie Cienfuegos Tai RN Navigator Cardiac Imaging °[facility] °[phone] office °[phone] cell ° °

## 2019-10-03 ENCOUNTER — Encounter: Payer: Medicare Other | Admitting: *Deleted

## 2019-10-03 ENCOUNTER — Ambulatory Visit (HOSPITAL_COMMUNITY)
Admission: RE | Admit: 2019-10-03 | Discharge: 2019-10-03 | Disposition: A | Discharge status: Home / Self Care | Payer: Medicare Other | Source: Ambulatory Visit | Attending: Internal Medicine | Admitting: Internal Medicine

## 2019-10-03 ENCOUNTER — Other Ambulatory Visit: Payer: Self-pay

## 2019-10-03 DIAGNOSIS — I209 Angina pectoris, unspecified: Secondary | ICD-10-CM

## 2019-10-03 DIAGNOSIS — I259 Chronic ischemic heart disease, unspecified: Secondary | ICD-10-CM | POA: Insufficient documentation

## 2019-10-03 DIAGNOSIS — Z006 Encounter for examination for normal comparison and control in clinical research program: Secondary | ICD-10-CM

## 2019-10-03 MED ORDER — NITROGLYCERIN 0.4 MG SL SUBL
0.8000 mg | SUBLINGUAL_TABLET | Freq: Once | SUBLINGUAL | Status: AC
  Administered 2019-10-03: 0.8 mg via SUBLINGUAL

## 2019-10-03 MED ORDER — NITROGLYCERIN 0.4 MG SL SUBL
SUBLINGUAL_TABLET | SUBLINGUAL | Status: AC
  Filled 2019-10-03: qty 2

## 2019-10-03 MED ORDER — IOHEXOL 350 MG/ML SOLN
80.0000 mL | Freq: Once | INTRAVENOUS | Status: AC | PRN
  Administered 2019-10-03: 80 mL via INTRAVENOUS

## 2019-10-03 NOTE — Research (Signed)
CADFEM Informed Consent                  Subject Name:   Malik Bowen   Subject met inclusion and exclusion criteria.  The informed consent form, study requirements and expectations were reviewed with the subject and questions and concerns were addressed prior to the signing of the consent form.  The subject verbalized understanding of the trial requirements.  The subject agreed to participate in the CADFEM trial and signed the informed consent.  The informed consent was obtained prior to performance of any protocol-specific procedures for the subject.  A copy of the signed informed consent was given to the subject and a copy was placed in the subject's medical record.   [place], Research Assistant  10/03/2019  13:40p.m.

## 2019-10-19 ENCOUNTER — Telehealth: Payer: Self-pay | Admitting: *Deleted

## 2019-10-19 NOTE — Telephone Encounter (Signed)
-----   Message from [place], MD sent at 10/05/2019  2:19 PM EDT ----- Minimal plaquing on coronary arteries   Calcium score is 17    I do not think ischemia caused CP Left voicemail Can stop aspirin Will need cholesterol control    COntinue lipitor and LDL will need to be followed

## 2019-10-19 NOTE — Telephone Encounter (Signed)
Reached patient on phone. He is aware to stop aspirin and will continue blood pressure and lipid management w his primary care provider.  I asked him to call if anything changes or has any further symptoms.  Adv will place recall for one year.

## 2019-10-26 DIAGNOSIS — I10 Essential (primary) hypertension: Secondary | ICD-10-CM | POA: Diagnosis not present

## 2019-10-26 DIAGNOSIS — M19042 Primary osteoarthritis, left hand: Secondary | ICD-10-CM | POA: Diagnosis not present

## 2019-10-26 DIAGNOSIS — E7849 Other hyperlipidemia: Secondary | ICD-10-CM | POA: Diagnosis not present

## 2019-12-26 DIAGNOSIS — M546 Pain in thoracic spine: Secondary | ICD-10-CM | POA: Diagnosis not present

## 2019-12-26 DIAGNOSIS — M5441 Lumbago with sciatica, right side: Secondary | ICD-10-CM | POA: Diagnosis not present

## 2019-12-26 DIAGNOSIS — M9903 Segmental and somatic dysfunction of lumbar region: Secondary | ICD-10-CM | POA: Diagnosis not present

## 2019-12-26 DIAGNOSIS — M9902 Segmental and somatic dysfunction of thoracic region: Secondary | ICD-10-CM | POA: Diagnosis not present

## 2019-12-26 DIAGNOSIS — M9905 Segmental and somatic dysfunction of pelvic region: Secondary | ICD-10-CM | POA: Diagnosis not present

## 2019-12-27 DIAGNOSIS — E7849 Other hyperlipidemia: Secondary | ICD-10-CM | POA: Diagnosis not present

## 2019-12-27 DIAGNOSIS — L239 Allergic contact dermatitis, unspecified cause: Secondary | ICD-10-CM | POA: Diagnosis not present

## 2019-12-27 DIAGNOSIS — M19042 Primary osteoarthritis, left hand: Secondary | ICD-10-CM | POA: Diagnosis not present

## 2019-12-27 DIAGNOSIS — I10 Essential (primary) hypertension: Secondary | ICD-10-CM | POA: Diagnosis not present

## 2020-01-02 DIAGNOSIS — M9905 Segmental and somatic dysfunction of pelvic region: Secondary | ICD-10-CM | POA: Diagnosis not present

## 2020-01-02 DIAGNOSIS — M9902 Segmental and somatic dysfunction of thoracic region: Secondary | ICD-10-CM | POA: Diagnosis not present

## 2020-01-02 DIAGNOSIS — M5441 Lumbago with sciatica, right side: Secondary | ICD-10-CM | POA: Diagnosis not present

## 2020-01-02 DIAGNOSIS — M9903 Segmental and somatic dysfunction of lumbar region: Secondary | ICD-10-CM | POA: Diagnosis not present

## 2020-01-02 DIAGNOSIS — M546 Pain in thoracic spine: Secondary | ICD-10-CM | POA: Diagnosis not present

## 2020-01-21 DIAGNOSIS — R3912 Poor urinary stream: Secondary | ICD-10-CM | POA: Diagnosis not present

## 2020-01-21 DIAGNOSIS — M545 Low back pain, unspecified: Secondary | ICD-10-CM | POA: Diagnosis not present

## 2020-01-21 DIAGNOSIS — S76311A Strain of muscle, fascia and tendon of the posterior muscle group at thigh level, right thigh, initial encounter: Secondary | ICD-10-CM | POA: Diagnosis not present

## 2020-01-24 DIAGNOSIS — M545 Low back pain, unspecified: Secondary | ICD-10-CM | POA: Diagnosis not present

## 2020-01-24 DIAGNOSIS — M259 Joint disorder, unspecified: Secondary | ICD-10-CM | POA: Diagnosis not present

## 2020-01-24 DIAGNOSIS — M533 Sacrococcygeal disorders, not elsewhere classified: Secondary | ICD-10-CM | POA: Diagnosis not present

## 2020-01-25 ENCOUNTER — Other Ambulatory Visit: Payer: Self-pay | Admitting: Orthopedic Surgery

## 2020-01-25 DIAGNOSIS — M259 Joint disorder, unspecified: Secondary | ICD-10-CM

## 2020-02-06 DIAGNOSIS — R197 Diarrhea, unspecified: Secondary | ICD-10-CM | POA: Diagnosis not present

## 2020-02-06 DIAGNOSIS — Z800 Family history of malignant neoplasm of digestive organs: Secondary | ICD-10-CM | POA: Diagnosis not present

## 2020-02-06 DIAGNOSIS — I10 Essential (primary) hypertension: Secondary | ICD-10-CM | POA: Diagnosis not present

## 2020-02-06 DIAGNOSIS — Z86010 Personal history of colonic polyps: Secondary | ICD-10-CM | POA: Diagnosis not present

## 2020-02-11 ENCOUNTER — Other Ambulatory Visit: Payer: Self-pay

## 2020-02-11 ENCOUNTER — Ambulatory Visit
Admission: RE | Admit: 2020-02-11 | Discharge: 2020-02-11 | Disposition: A | Discharge status: Home / Self Care | Payer: Medicare Other | Source: Ambulatory Visit | Attending: Orthopedic Surgery | Admitting: Orthopedic Surgery

## 2020-02-11 ENCOUNTER — Other Ambulatory Visit: Payer: Medicare Other

## 2020-02-11 DIAGNOSIS — M259 Joint disorder, unspecified: Secondary | ICD-10-CM

## 2020-02-11 DIAGNOSIS — M461 Sacroiliitis, not elsewhere classified: Secondary | ICD-10-CM | POA: Diagnosis not present

## 2020-02-18 DIAGNOSIS — T50905A Adverse effect of unspecified drugs, medicaments and biological substances, initial encounter: Secondary | ICD-10-CM | POA: Diagnosis not present

## 2020-02-18 DIAGNOSIS — J209 Acute bronchitis, unspecified: Secondary | ICD-10-CM | POA: Diagnosis not present

## 2020-02-26 DIAGNOSIS — M19042 Primary osteoarthritis, left hand: Secondary | ICD-10-CM | POA: Diagnosis not present

## 2020-02-26 DIAGNOSIS — I10 Essential (primary) hypertension: Secondary | ICD-10-CM | POA: Diagnosis not present

## 2020-02-26 DIAGNOSIS — E7849 Other hyperlipidemia: Secondary | ICD-10-CM | POA: Diagnosis not present

## 2020-03-03 DIAGNOSIS — M546 Pain in thoracic spine: Secondary | ICD-10-CM | POA: Diagnosis not present

## 2020-03-03 DIAGNOSIS — M9903 Segmental and somatic dysfunction of lumbar region: Secondary | ICD-10-CM | POA: Diagnosis not present

## 2020-03-03 DIAGNOSIS — M9902 Segmental and somatic dysfunction of thoracic region: Secondary | ICD-10-CM | POA: Diagnosis not present

## 2020-03-03 DIAGNOSIS — M9905 Segmental and somatic dysfunction of pelvic region: Secondary | ICD-10-CM | POA: Diagnosis not present

## 2020-03-03 DIAGNOSIS — M5441 Lumbago with sciatica, right side: Secondary | ICD-10-CM | POA: Diagnosis not present

## 2020-03-17 DIAGNOSIS — M792 Neuralgia and neuritis, unspecified: Secondary | ICD-10-CM | POA: Diagnosis not present

## 2020-03-17 DIAGNOSIS — E119 Type 2 diabetes mellitus without complications: Secondary | ICD-10-CM | POA: Diagnosis not present

## 2020-03-26 ENCOUNTER — Ambulatory Visit: Payer: Medicare Other | Admitting: Urology

## 2020-04-07 DIAGNOSIS — M5416 Radiculopathy, lumbar region: Secondary | ICD-10-CM | POA: Diagnosis not present

## 2020-04-09 DIAGNOSIS — M5416 Radiculopathy, lumbar region: Secondary | ICD-10-CM | POA: Diagnosis not present

## 2020-04-10 ENCOUNTER — Ambulatory Visit (INDEPENDENT_AMBULATORY_CARE_PROVIDER_SITE_OTHER): Payer: Medicare Other | Admitting: Urology

## 2020-04-10 ENCOUNTER — Other Ambulatory Visit: Payer: Self-pay

## 2020-04-10 ENCOUNTER — Encounter: Payer: Self-pay | Admitting: Urology

## 2020-04-10 VITALS — BP 122/70 | HR 64 | Temp 98.4°F | Ht 71.0 in | Wt 246.0 lb

## 2020-04-10 DIAGNOSIS — N138 Other obstructive and reflux uropathy: Secondary | ICD-10-CM | POA: Insufficient documentation

## 2020-04-10 DIAGNOSIS — N401 Enlarged prostate with lower urinary tract symptoms: Secondary | ICD-10-CM

## 2020-04-10 DIAGNOSIS — R3912 Poor urinary stream: Secondary | ICD-10-CM | POA: Insufficient documentation

## 2020-04-10 LAB — URINALYSIS, ROUTINE W REFLEX MICROSCOPIC
Bilirubin, UA: NEGATIVE
Glucose, UA: NEGATIVE
Ketones, UA: NEGATIVE
Leukocytes,UA: NEGATIVE
Nitrite, UA: NEGATIVE
Protein,UA: NEGATIVE
RBC, UA: NEGATIVE
Specific Gravity, UA: 1.02 (ref 1.005–1.030)
Urobilinogen, Ur: 0.2 mg/dL (ref 0.2–1.0)
pH, UA: 6.5 (ref 5.0–7.5)

## 2020-04-10 LAB — BLADDER SCAN AMB NON-IMAGING: Scan Result: 65

## 2020-04-10 MED ORDER — ALFUZOSIN HCL ER 10 MG PO TB24
10.0000 mg | ORAL_TABLET | Freq: Every day | ORAL | 11 refills | Status: DC
Start: 2020-04-10 — End: 2020-10-13

## 2020-04-10 NOTE — Patient Instructions (Signed)

## 2020-04-10 NOTE — Progress Notes (Signed)
04/10/2020 9:06 AM   [name] 05/13/1950 [id number]  Referring provider: Jasraj Munch, PA-C [place],  [place] [address]  Weak urinary stream  HPI: Mr Heikes is a 69yo here for evaluation of a weak urinary stream. For the past 2 years he has noted a weaker stream, worsening hesitancy, dribbling. He has associated nocturia 2x. He denies any dysuria or hematuria. He has starting/stopping of his urinary stream. He tried flomax for 2 weeks which failed to improve his urinary stream, dribbling. He is bothered by his urination.   PMH: Past Medical History:  Diagnosis Date  . Diabetes mellitus without complication ([place])   . Hyperlipidemia   . Hypertension     Surgical History: Past Surgical History:  Procedure Laterality Date  . EYE SURGERY    . LASIK      Home Medications:  Allergies as of 04/10/2020      Reactions   Other    Walnuts and strawberrys cause mouth sores      Medication List       Accurate as of April 10, 2020  9:06 AM. If you have any questions, ask your nurse or doctor.        atorvastatin 40 MG tablet Commonly known as: LIPITOR Take 40 mg by mouth daily.   celecoxib 200 MG capsule Commonly known as: CELEBREX Take 200 mg by mouth as needed.   hydrochlorothiazide 25 MG tablet Commonly known as: HYDRODIURIL Take 25 mg by mouth daily.   HYDROcodone-acetaminophen 5-325 MG tablet Commonly known as: NORCO/VICODIN Take 1-2 tablets by mouth every 6 (six) hours as needed.   metFORMIN 500 MG tablet Commonly known as: GLUCOPHAGE Take 500 mg by mouth daily with breakfast.   metoprolol tartrate 100 MG tablet Commonly known as: LOPRESSOR Take 1 tablet (100 mg) 2 hours before CT   olmesartan 40 MG tablet Commonly known as: BENICAR Take 40 mg by mouth daily.   tadalafil 20 MG tablet Commonly known as: CIALIS Take 20 mg by mouth daily as needed.   triamcinolone 0.1 % Commonly known as: KENALOG triamcinolone acetonide  0.1 % topical cream  APPLY TWICE DAILY FOR UP TO 2 WEEKS CONTINOUSLY THEN TAKE 2 WEEKS OFF BEFORE RESTARTING       Allergies:  Allergies  Allergen Reactions  . Other     Walnuts and strawberrys cause mouth sores    Family History: Family History  Problem Relation Age of Onset  . Heart failure Father   . Heart failure Brother   . Prostate cancer Brother   . Kidney failure Brother     Social History:  reports that he has never smoked. He has never used smokeless tobacco. He reports that he does not drink alcohol and does not use drugs.  ROS: All other review of systems were reviewed and are negative except what is noted above in HPI  Physical Exam: BP 122/70   Pulse 64   Temp 98.4 F (36.9 C)   Ht 5\' 11"  (1.803 m)   Wt 246 lb (111.6 kg)   BMI 34.31 kg/m   Constitutional:  Alert and oriented, No acute distress. HEENT: [place] AT, moist mucus membranes.  Trachea midline, no masses. Cardiovascular: No clubbing, cyanosis, or edema. Respiratory: Normal respiratory effort, no increased work of breathing. GI: Abdomen is soft, nontender, nondistended, no abdominal masses GU: No CVA tenderness. Circumcised phallus. No masses/lesions on penis, testis, scrotum. Prostate 40g smooth no nodules no induration.  Lymph: No cervical  or inguinal lymphadenopathy. Skin: No rashes, bruises or suspicious lesions. Neurologic: Grossly intact, no focal deficits, moving all 4 extremities. Psychiatric: Normal mood and affect.  Laboratory Data: Lab Results  Component Value Date   WBC 8.5 03/12/2017   HGB 12.7 (L) 03/12/2017   HCT 39.1 03/12/2017   MCV 84.3 03/12/2017   PLT 280 03/12/2017    Lab Results  Component Value Date   CREATININE 1.19 09/14/2019    No results found for: PSA  No results found for: TESTOSTERONE  No results found for: HGBA1C  Urinalysis No results found for: COLORURINE, APPEARANCEUR, LABSPEC, PHURINE, GLUCOSEU, HGBUR, BILIRUBINUR, KETONESUR, PROTEINUR,  UROBILINOGEN, NITRITE, LEUKOCYTESUR  No results found for: LABMICR, [place], RBCUA, LABEPIT, MUCUS, BACTERIA  Pertinent Imaging:  No results found for this or any previous visit.  No results found for this or any previous visit.  No results found for this or any previous visit.  No results found for this or any previous visit.  No results found for this or any previous visit.  No results found for this or any previous visit.  No results found for this or any previous visit.  No results found for this or any previous visit.   Assessment & Plan:    1. Weak urinary stream -We will trial uroxatral 10mg  qhs - Urinalysis, Routine w reflex microscopic - Bladder Scan (Post Void Residual) in office   No follow-ups on file.  Nicolette Bang, MD  [facility] [place]

## 2020-04-10 NOTE — Progress Notes (Signed)
Bladder Scan Patient can void: 65 ml Performed By: Lenah Messenger,lpn    Urological Symptom Review  Patient is experiencing the following symptoms: Frequent urination Get up at night to urinate Stream starts and stops Weak stream Erection problems (male only)   Review of Systems  Gastrointestinal (upper)  : Negative for upper GI symptoms  Gastrointestinal (lower) : Negative for lower GI symptoms  Constitutional : Negative for symptoms  Skin: Negative for skin symptoms  Eyes: Negative for eye symptoms  Ear/Nose/Throat : Negative for Ear/Nose/Throat symptoms  Hematologic/Lymphatic: Negative for Hematologic/Lymphatic symptoms  Cardiovascular : Negative for cardiovascular symptoms  Respiratory : Negative for respiratory symptoms  Endocrine: Negative for endocrine symptoms  Musculoskeletal: Back pain Joint pain  Neurological: Negative for neurological symptoms  Psychologic: Negative for psychiatric symptoms

## 2020-04-15 DIAGNOSIS — M5416 Radiculopathy, lumbar region: Secondary | ICD-10-CM | POA: Diagnosis not present

## 2020-04-18 DIAGNOSIS — M5416 Radiculopathy, lumbar region: Secondary | ICD-10-CM | POA: Diagnosis not present

## 2020-04-18 DIAGNOSIS — E119 Type 2 diabetes mellitus without complications: Secondary | ICD-10-CM | POA: Diagnosis not present

## 2020-04-22 DIAGNOSIS — M5416 Radiculopathy, lumbar region: Secondary | ICD-10-CM | POA: Diagnosis not present

## 2020-04-24 DIAGNOSIS — M5416 Radiculopathy, lumbar region: Secondary | ICD-10-CM | POA: Diagnosis not present

## 2020-05-09 DIAGNOSIS — M9903 Segmental and somatic dysfunction of lumbar region: Secondary | ICD-10-CM | POA: Diagnosis not present

## 2020-05-09 DIAGNOSIS — M5441 Lumbago with sciatica, right side: Secondary | ICD-10-CM | POA: Diagnosis not present

## 2020-05-09 DIAGNOSIS — M9905 Segmental and somatic dysfunction of pelvic region: Secondary | ICD-10-CM | POA: Diagnosis not present

## 2020-05-09 DIAGNOSIS — M546 Pain in thoracic spine: Secondary | ICD-10-CM | POA: Diagnosis not present

## 2020-05-09 DIAGNOSIS — M9902 Segmental and somatic dysfunction of thoracic region: Secondary | ICD-10-CM | POA: Diagnosis not present

## 2020-05-28 ENCOUNTER — Encounter: Payer: Self-pay | Admitting: Urology

## 2020-05-28 ENCOUNTER — Ambulatory Visit (INDEPENDENT_AMBULATORY_CARE_PROVIDER_SITE_OTHER): Payer: Medicare Other | Admitting: Urology

## 2020-05-28 ENCOUNTER — Other Ambulatory Visit: Payer: Self-pay

## 2020-05-28 VITALS — BP 122/68 | HR 60 | Temp 98.4°F | Ht 71.0 in | Wt 250.0 lb

## 2020-05-28 DIAGNOSIS — N138 Other obstructive and reflux uropathy: Secondary | ICD-10-CM

## 2020-05-28 DIAGNOSIS — R3912 Poor urinary stream: Secondary | ICD-10-CM

## 2020-05-28 DIAGNOSIS — N401 Enlarged prostate with lower urinary tract symptoms: Secondary | ICD-10-CM

## 2020-05-28 LAB — URINALYSIS, ROUTINE W REFLEX MICROSCOPIC
Bilirubin, UA: NEGATIVE
Glucose, UA: NEGATIVE
Ketones, UA: NEGATIVE
Leukocytes,UA: NEGATIVE
Nitrite, UA: NEGATIVE
Protein,UA: NEGATIVE
RBC, UA: NEGATIVE
Specific Gravity, UA: 1.02 (ref 1.005–1.030)
Urobilinogen, Ur: 0.2 mg/dL (ref 0.2–1.0)
pH, UA: 5.5 (ref 5.0–7.5)

## 2020-05-28 MED ORDER — TAMSULOSIN HCL 0.4 MG PO CAPS: 0.4000 mg | ORAL_CAPSULE | Freq: Every day | ORAL | 11 refills | Status: DC

## 2020-05-28 NOTE — Progress Notes (Signed)
Bladder Scan Patient can void: 14 ml Performed By: Durenda Guthrie, lpn  Urological Symptom Review  Patient is experiencing the following symptoms: Stream starts and stops   Review of Systems  Gastrointestinal (upper)  : Negative for upper GI symptoms  Gastrointestinal (lower) : Negative for lower GI symptoms  Constitutional : Negative for symptoms  Skin: Negative for skin symptoms  Eyes: Negative for eye symptoms  Ear/Nose/Throat : Negative for Ear/Nose/Throat symptoms  Hematologic/Lymphatic: Negative for Hematologic/Lymphatic symptoms  Cardiovascular : Negative for cardiovascular symptoms  Respiratory : Negative for respiratory symptoms  Endocrine: Negative for endocrine symptoms  Musculoskeletal: Negative for musculoskeletal symptoms  Neurological: Negative for neurological symptoms  Psychologic: Negative for psychiatric symptoms

## 2020-05-28 NOTE — Patient Instructions (Signed)

## 2020-05-28 NOTE — Progress Notes (Signed)
05/28/2020 9:42 AM   Malik Bowen 05-26-1950 [id number]  Referring provider: Kei Munch, PA-C [place],  [place] [address]  Followup weak stream  HPI: [name] is [phone] here for followup for BPH and weak urinary stream. He took uroxatral which failed to improve his nocturia. His weak stream is unimproved. He is not happy with his response to the uroxatral. IPSS 11 and QOL 2.    PMH: Past Medical History:  Diagnosis Date  . Diabetes mellitus without complication ([place])   . Hyperlipidemia   . Hypertension     Surgical History: Past Surgical History:  Procedure Laterality Date  . EYE SURGERY    . LASIK      Home Medications:  Allergies as of 05/28/2020      Reactions   Other    Walnuts and strawberrys cause mouth sores      Medication List       Accurate as of May 28, 2020  9:42 AM. If you have any questions, ask your nurse or doctor.        STOP taking these medications   HYDROcodone-acetaminophen 5-325 MG tablet Commonly known as: NORCO/VICODIN Stopped by: Nicolette Bang, MD   metoprolol tartrate 100 MG tablet Commonly known as: LOPRESSOR Stopped by: Nicolette Bang, MD   tadalafil 20 MG tablet Commonly known as: CIALIS Stopped by: Nicolette Bang, MD   triamcinolone 0.1 % Commonly known as: KENALOG Stopped by: Nicolette Bang, MD     TAKE these medications   alfuzosin 10 MG 24 hr tablet Commonly known as: UROXATRAL Take 1 tablet (10 mg total) by mouth at bedtime.   atorvastatin 40 MG tablet Commonly known as: LIPITOR Take 40 mg by mouth daily.   celecoxib 200 MG capsule Commonly known as: CELEBREX Take 200 mg by mouth as needed.   hydrochlorothiazide 25 MG tablet Commonly known as: HYDRODIURIL Take 25 mg by mouth daily.   metFORMIN 500 MG tablet Commonly known as: GLUCOPHAGE Take 500 mg by mouth daily with breakfast.   olmesartan 40 MG tablet Commonly known as: BENICAR Take 40 mg by mouth daily.        Allergies:  Allergies  Allergen Reactions  . Other     Walnuts and strawberrys cause mouth sores    Family History: Family History  Problem Relation Age of Onset  . Heart failure Father   . Heart failure Brother   . Prostate cancer Brother   . Kidney failure Brother     Social History:  reports that he has never smoked. He has never used smokeless tobacco. He reports that he does not drink alcohol and does not use drugs.  ROS: All other review of systems were reviewed and are negative except what is noted above in HPI  Physical Exam: BP 122/68   Pulse 60   Temp 98.4 F (36.9 C)   Ht 5\' 11"  (1.803 m)   Wt 250 lb (113.4 kg)   BMI 34.87 kg/m   Constitutional:  Alert and oriented, No acute distress. HEENT: [place] AT, moist mucus membranes.  Trachea midline, no masses. Cardiovascular: No clubbing, cyanosis, or edema. Respiratory: Normal respiratory effort, no increased work of breathing. GI: Abdomen is soft, nontender, nondistended, no abdominal masses GU: No CVA tenderness.  Lymph: No cervical or inguinal lymphadenopathy. Skin: No rashes, bruises or suspicious lesions. Neurologic: Grossly intact, no focal deficits, moving all 4 extremities. Psychiatric: Normal mood and affect.  Laboratory Data: Lab Results  Component Value  Date   WBC 8.5 03/12/2017   HGB 12.7 (L) 03/12/2017   HCT 39.1 03/12/2017   MCV 84.3 03/12/2017   PLT 280 03/12/2017    Lab Results  Component Value Date   CREATININE 1.19 09/14/2019    No results found for: PSA  No results found for: TESTOSTERONE  No results found for: HGBA1C  Urinalysis    Component Value Date/Time   APPEARANCEUR Clear 04/10/2020 0853   GLUCOSEU Negative 04/10/2020 0853   BILIRUBINUR Negative 04/10/2020 0853   PROTEINUR Negative 04/10/2020 0853   NITRITE Negative 04/10/2020 0853   LEUKOCYTESUR Negative 04/10/2020 0853    Lab Results  Component Value Date   LABMICR Comment 04/10/2020    Pertinent  Imaging:  No results found for this or any previous visit.  No results found for this or any previous visit.  No results found for this or any previous visit.  No results found for this or any previous visit.  No results found for this or any previous visit.  No results found for this or any previous visit.  No results found for this or any previous visit.  No results found for this or any previous visit.   Assessment & Plan:    1. Benign prostatic hyperplasia with urinary obstruction -We will start flomax 0.4mg   - Urinalysis, Routine w reflex microscopic - BLADDER SCAN AMB NON-IMAGING  2. Weak urinary stream -flomax 0.4mg     No follow-ups on file.  Nicolette Bang, MD  [facility] [place]

## 2020-05-29 DIAGNOSIS — M461 Sacroiliitis, not elsewhere classified: Secondary | ICD-10-CM | POA: Diagnosis not present

## 2020-05-29 DIAGNOSIS — I10 Essential (primary) hypertension: Secondary | ICD-10-CM | POA: Diagnosis not present

## 2020-06-12 DIAGNOSIS — M461 Sacroiliitis, not elsewhere classified: Secondary | ICD-10-CM | POA: Diagnosis not present

## 2020-07-02 ENCOUNTER — Telehealth: Payer: Self-pay

## 2020-07-02 DIAGNOSIS — M461 Sacroiliitis, not elsewhere classified: Secondary | ICD-10-CM | POA: Diagnosis not present

## 2020-07-03 ENCOUNTER — Other Ambulatory Visit: Payer: Self-pay

## 2020-07-03 DIAGNOSIS — N138 Other obstructive and reflux uropathy: Secondary | ICD-10-CM

## 2020-07-03 MED ORDER — SILODOSIN 8 MG PO CAPS: 8.0000 mg | ORAL_CAPSULE | Freq: Every day | ORAL | 11 refills | Status: DC

## 2020-07-03 MED ORDER — SILODOSIN 8 MG PO CAPS
8.0000 mg | ORAL_CAPSULE | Freq: Every day | ORAL | 11 refills | Status: DC
Start: 2020-07-03 — End: 2020-07-03

## 2020-07-03 NOTE — Telephone Encounter (Signed)
Rapaflo 8mg  daily. #30 11 refills

## 2020-07-03 NOTE — Telephone Encounter (Signed)
Called pt and notified new med sent in.

## 2020-07-03 NOTE — Progress Notes (Signed)
apaflo

## 2020-07-23 ENCOUNTER — Telehealth (INDEPENDENT_AMBULATORY_CARE_PROVIDER_SITE_OTHER): Payer: Medicare Other | Admitting: Urology

## 2020-07-23 ENCOUNTER — Other Ambulatory Visit: Payer: Self-pay

## 2020-07-23 DIAGNOSIS — N401 Enlarged prostate with lower urinary tract symptoms: Secondary | ICD-10-CM

## 2020-07-23 DIAGNOSIS — N138 Other obstructive and reflux uropathy: Secondary | ICD-10-CM

## 2020-07-23 DIAGNOSIS — R3912 Poor urinary stream: Secondary | ICD-10-CM

## 2020-07-23 NOTE — Progress Notes (Signed)
07/23/2020 9:39 AM   Malik Bowen 09/30/1950 [id number]  Referring provider: Montray Munch, PA-C [address] [place],  [place] [address]   Patient location: home Physician location: office I connected with  Malik Bowen on 08/05/20 by a video enabled telemedicine application and verified that I am speaking with the correct person using two identifiers.   I discussed the limitations of evaluation and management by telemedicine. The patient expressed understanding and agreed to proceed.   followup BPH  HPI: Malik Bowen is a 70yo here for followup for BPH. He has previously tried uroxatral and rapaflo which caused dizziness and difficulty concentrating. He continues to have moderate to severe LUTS off the medication. He is unhappy with his urination but he cannot tolerate the alpha blockers. Urine stream fair. Nocturia 3-5x.    PMH: Past Medical History:  Diagnosis Date  . Diabetes mellitus without complication ([place])   . Hyperlipidemia   . Hypertension     Surgical History: Past Surgical History:  Procedure Laterality Date  . EYE SURGERY    . LASIK      Home Medications:  Allergies as of 07/23/2020      Reactions   Other    Walnuts and strawberrys cause mouth sores      Medication List       Accurate as of July 23, 2020  9:39 AM. If you have any questions, ask your nurse or doctor.        alfuzosin 10 MG 24 hr tablet Commonly known as: UROXATRAL Take 1 tablet (10 mg total) by mouth at bedtime.   atorvastatin 40 MG tablet Commonly known as: LIPITOR Take 40 mg by mouth daily.   celecoxib 200 MG capsule Commonly known as: CELEBREX Take 200 mg by mouth as needed.   hydrochlorothiazide 25 MG tablet Commonly known as: HYDRODIURIL Take 25 mg by mouth daily.   metFORMIN 500 MG tablet Commonly known as: GLUCOPHAGE Take 500 mg by mouth daily with breakfast.   olmesartan 40 MG tablet Commonly known as: BENICAR Take 40 mg by mouth daily.    silodosin 8 MG Caps capsule Commonly known as: RAPAFLO Take 1 capsule (8 mg total) by mouth daily with breakfast.       Allergies:  Allergies  Allergen Reactions  . Other     Walnuts and strawberrys cause mouth sores    Family History: Family History  Problem Relation Age of Onset  . Heart failure Father   . Heart failure Brother   . Prostate cancer Brother   . Kidney failure Brother     Social History:  reports that he has never smoked. He has never used smokeless tobacco. He reports that he does not drink alcohol and does not use drugs.  ROS: All other review of systems were reviewed and are negative except what is noted above in HPI  Physical Exam: There were no vitals taken for this visit.  Constitutional:  Alert and oriented, No acute distress. HEENT: [place] AT, moist mucus membranes.  Trachea midline, no masses. Cardiovascular: No clubbing, cyanosis, or edema. Respiratory: Normal respiratory effort, no increased work of breathing. GI: Abdomen is soft, nontender, nondistended, no abdominal masses GU: No CVA tenderness.  Lymph: No cervical or inguinal lymphadenopathy. Skin: No rashes, bruises or suspicious lesions. Neurologic: Grossly intact, no focal deficits, moving all 4 extremities. Psychiatric: Normal mood and affect.  Laboratory Data: Lab Results  Component Value Date   WBC 8.5 03/12/2017   HGB 12.7 (L)  03/12/2017   HCT 39.1 03/12/2017   MCV 84.3 03/12/2017   PLT 280 03/12/2017    Lab Results  Component Value Date   CREATININE 1.19 09/14/2019    No results found for: PSA  No results found for: TESTOSTERONE  No results found for: HGBA1C  Urinalysis    Component Value Date/Time   APPEARANCEUR Clear 05/28/2020 0934   GLUCOSEU Negative 05/28/2020 0934   BILIRUBINUR Negative 05/28/2020 0934   PROTEINUR Negative 05/28/2020 0934   NITRITE Negative 05/28/2020 0934   LEUKOCYTESUR Negative 05/28/2020 0934    Lab Results  Component Value Date    LABMICR Comment 05/28/2020    Pertinent Imaging:  No results found for this or any previous visit.  No results found for this or any previous visit.  No results found for this or any previous visit.  No results found for this or any previous visit.  No results found for this or any previous visit.  No results found for this or any previous visit.  No results found for this or any previous visit.  No results found for this or any previous visit.   Assessment & Plan:    1. Weak urinary stream Patient wishes to stop alpha blockers at this time. I will see him back in 3 months for possible cystoscopy  2. Benign prostatic hyperplasia with urinary obstruction -observation since patient does not tolerate alpha blockers   No follow-ups on file.  Nicolette Bang, MD  [facility] [place]

## 2020-07-23 NOTE — Patient Instructions (Signed)

## 2020-07-26 DIAGNOSIS — I10 Essential (primary) hypertension: Secondary | ICD-10-CM | POA: Diagnosis not present

## 2020-07-26 DIAGNOSIS — E7849 Other hyperlipidemia: Secondary | ICD-10-CM | POA: Diagnosis not present

## 2020-08-04 NOTE — Progress Notes (Signed)
[facility] Note  08/05/2020     CHIEF COMPLAINT Patient presents for Retina Follow Up   HISTORY OF PRESENT ILLNESS: Malik Bowen is a 70 y.o. male who presents to the clinic today for:   HPI    Retina Follow Up    Patient presents with  Diabetic Retinopathy.  In both eyes.  This started weeks ago.  Severity is moderate.  Duration of weeks.  Since onset it is stable.  I, the attending physician,  performed the HPI with the patient and updated documentation appropriately.          Comments    Pt states vision is the same OU--doesn't wear glasses all the time.  Pt denies eye pain.  Pt states right eye is irritated today.  Pt still has floaters OU--states he does not see them as much anymore.       Last edited by Bernarda Caffey, MD on 08/06/2020  4:58 PM. (History)    Pt states vision is okay, but he occasionally sees double, but usually only when looking down, he went to see his regular eye dr a couple months ago, pt states his A1c is around 6.1, he takes 500mg  of metformin once a day  Referring physician: Savvas Munch, PA-C [place],  [place] [address]  HISTORICAL INFORMATION:   Selected notes from the [place] referred for DM exam   CURRENT MEDICATIONS: No current outpatient medications on file. (Ophthalmic Drugs)   No current facility-administered medications for this visit. (Ophthalmic Drugs)   Current Outpatient Medications (Other)  Medication Sig  . alfuzosin (UROXATRAL) 10 MG 24 hr tablet Take 1 tablet (10 mg total) by mouth at bedtime.  Marland Kitchen atorvastatin (LIPITOR) 40 MG tablet Take 40 mg by mouth daily.  . celecoxib (CELEBREX) 200 MG capsule Take 200 mg by mouth as needed.   . hydrochlorothiazide (HYDRODIURIL) 25 MG tablet Take 25 mg by mouth daily.  . metFORMIN (GLUCOPHAGE) 500 MG tablet Take 500 mg by mouth daily with breakfast.   . olmesartan (BENICAR) 40 MG tablet Take 40 mg by mouth daily.  .  silodosin (RAPAFLO) 8 MG CAPS capsule Take 1 capsule (8 mg total) by mouth daily with breakfast.   No current facility-administered medications for this visit. (Other)      REVIEW OF SYSTEMS: ROS    Positive for: Musculoskeletal, Endocrine, Eyes   Negative for: Constitutional, Gastrointestinal, Neurological, Skin, Genitourinary, HENT, Cardiovascular, Respiratory, Psychiatric, Allergic/Imm, Heme/Lymph   Last edited by Doneen Poisson on 08/05/2020  8:02 AM. (History)       ALLERGIES Allergies  Allergen Reactions  . Other     Walnuts and strawberrys cause mouth sores    PAST MEDICAL HISTORY Past Medical History:  Diagnosis Date  . Diabetes mellitus without complication ([place])   . Hyperlipidemia   . Hypertension    Past Surgical History:  Procedure Laterality Date  . EYE SURGERY    . LASIK      FAMILY HISTORY Family History  Problem Relation Age of Onset  . Heart failure Father   . Heart failure Brother   . Prostate cancer Brother   . Kidney failure Brother     SOCIAL HISTORY Social History   Tobacco Use  . Smoking status: Never Smoker  . Smokeless tobacco: Never Used  Vaping Use  . Vaping Use: Never used  Substance Use Topics  . Alcohol use: No  . Drug use:  No         OPHTHALMIC EXAM:  Base Eye Exam    Visual Acuity (Snellen - Linear)      Right Left   Dist [place] 20/25 -2 20/20 -2   Dist ph [place] 20/20 -2        Tonometry (Tonopen, 8:10 AM)      Right Left   Pressure 15 17       Pupils      Dark Light Shape React APD   Right 3 2 Round Brisk 0   Left 3 2 Round Brisk 0       Visual Fields      Left Right    Full Full       Extraocular Movement      Right Left    Full Full       Neuro/Psych    Oriented x3: Yes   Mood/Affect: Normal       Dilation    Both eyes: 1.0% Mydriacyl, 2.5% Phenylephrine @ 8:10 AM        Slit Lamp and Fundus Exam    Slit Lamp Exam      Right Left   Lids/Lashes Dermatochalasis - upper lid  Dermatochalasis - upper lid, Meibomian gland dysfunction   Conjunctiva/Sclera White and quiet White and quiet   Cornea Arcus, 2+ fine Punctate epithelial erosions inferiorly, barely visible lasik flap, 2+ Guttata Arcus, trace inferior Punctate epithelial erosions, barely visible lasik flap, 2+ Guttata   Anterior Chamber Deep and quiet Deep and quiet   Iris Round and dilated, No NVI Round and dilated, No NVI   Lens 2+ Nuclear sclerosis, 2-3+ Cortical cataract 2-3+ Nuclear sclerosis, 2-3+ Cortical cataract   Vitreous Vitreous syneresis, Posterior vitreous detachment, vitreous condesations greatest inferiorly Vitreous syneresis, Posterior vitreous detachment, vitreous condensations inferiorly       Fundus Exam      Right Left   Disc pink and sharp, Tilted disc, Temporal Peripapillary atrophy pink and sharp, Tilted disc, Temporal Peripapillary atrophy and pigment   C/D Ratio 0.4 0.5   Macula Flat, blunted foveal reflex, trace ERM, mild Retinal pigment epithelial mottling, No heme or edema Flat, blunted foveal reflex, mild Retinal pigment epithelial mottling, No heme or edema   Vessels mild attenuation mild attenuation   Periphery Attached, mild paving stone inferiorly, mild pigmented Chorioretinal scar at 0730, no heme Attached, mild peripheral CR atrophy temporally, no heme        Refraction    Wearing Rx      Sphere Cylinder Axis Add   Right [place] +0.25 025 +2.50   Left -0.75 +1.00 158 +2.50   Type: PAL          IMAGING AND PROCEDURES  Imaging and Procedures for @TODAY @  OCT, Retina - OU - Both Eyes       Right Eye Quality was good. Central Foveal Thickness: 291. Progression has worsened. Findings include no IRF, no SRF, epiretinal membrane, abnormal foveal contour (Interval development of ERM and blunting of foveal depression).   Left Eye Quality was good. Central Foveal Thickness: 285. Progression has been stable. Findings include normal foveal contour, no IRF, no SRF (Trace  ERM superior macula).   Notes *Images captured and stored on drive  Diagnosis / Impression:  No DME OU OD: Interval development of ERM and blunting of foveal depression  Clinical management:  See below  Abbreviations: NFP - Normal foveal profile. CME - cystoid macular edema. PED - pigment epithelial detachment. IRF -  intraretinal fluid. SRF - subretinal fluid. EZ - ellipsoid zone. ERM - epiretinal membrane. ORA - outer retinal atrophy. ORT - outer retinal tubulation. SRHM - subretinal hyper-reflective material                 ASSESSMENT/PLAN:    ICD-10-CM   1. Diabetes mellitus type 2 without retinopathy ([place])  E11.9   2. Posterior vitreous detachment of both eyes  H43.813   3. Epiretinal membrane (ERM) of right eye  H35.371   4. Retinal edema  H35.81 OCT, Retina - OU - Both Eyes  5. Hx of LASIK  Z98.890   6. Combined forms of age-related cataract of both eyes  H25.813     1. Diabetes mellitus, type 2 without retinopathy  - stable from prior  - pt self reports A1c of 6.1%  - The incidence, risk factors for progression, natural history and treatment options for diabetic retinopathy  were discussed with patient.    - The need for close monitoring of blood glucose, blood pressure, and serum lipids, avoiding cigarette or any type of tobacco, and the need for long term follow up was also discussed with patient.  - f/u in 1 year, sooner prn  2. PVD / vitreous syneresis  - fairly prominent vitreous condensations  - stable   - Discussed findings and prognosis  - No RT or RD on 360 peripheral exam  - Reviewed s/s of RT/RD  - Strict return precautions for any such RT/RD signs/symptoms  3,4. Epiretinal membrane, OD  - The natural history, anatomy, potential for loss of vision, and treatment options including vitrectomy techniques and the complications of endophthalmitis, retinal detachment, vitreous hemorrhage, cataract progression and permanent vision loss discussed with the  patient. - Interval development of ERM and blunting of foveal depression - BCVA 20/20 - asymptomatic, no metamorphopsia - no indication for surgery at this time - monitor for now - f/u 1 year -- DFE/OCT  5. Hx of Lasik OU-   - doing well  - monitor  6. Combined forma age-related cataracts OU-   - The symptoms of cataract, surgical options, and treatments and risks were discussed with patient.  - discussed diagnosis and progression  - not yet visually significant  - monitor for now   Ophthalmic Meds Ordered this visit:  No orders of the defined types were placed in this encounter.      Return in about 1 year (around 08/05/2021) for f/u DM exam, DFE, OCT.  There are no Patient Instructions on file for this visit.  This document serves as a record of services personally performed by Gardiner Sleeper, MD, PhD. It was created on their behalf by Leeann Must, [place], an ophthalmic technician. The creation of this record is the provider's dictation and/or activities during the visit.    Electronically signed by: Leeann Must, COA @TODAY @ 5:01 PM   This document serves as a record of services personally performed by Gardiner Sleeper, MD, PhD. It was created on their behalf by San Jetty. Owens Shark, OA an ophthalmic technician. The creation of this record is the provider's dictation and/or activities during the visit.    Electronically signed by: San Jetty. Owens Shark, [place] 05.10.2022 5:01 PM  Gardiner Sleeper, M.D., Ph.D. Diseases & Surgery of the Retina and [place] 08/05/2020   I have reviewed the above documentation for accuracy and completeness, and I agree with the above. Gardiner Sleeper, M.D., Ph.D. 08/06/20 5:01 PM  Abbreviations: M myopia (nearsighted); A astigmatism; H hyperopia (farsighted); P presbyopia; Mrx spectacle prescription;  CTL contact lenses; OD right eye; OS left eye; OU both eyes  XT exotropia; ET esotropia; PEK punctate epithelial  keratitis; PEE punctate epithelial erosions; DES dry eye syndrome; MGD meibomian gland dysfunction; ATs artificial tears; PFAT's preservative free artificial tears; [place] nuclear sclerotic cataract; PSC posterior subcapsular cataract; ERM epi-retinal membrane; PVD posterior vitreous detachment; RD retinal detachment; DM diabetes mellitus; DR diabetic retinopathy; NPDR non-proliferative diabetic retinopathy; PDR proliferative diabetic retinopathy; CSME clinically significant macular edema; DME diabetic macular edema; dbh dot blot hemorrhages; CWS cotton wool spot; POAG primary open angle glaucoma; C/D cup-to-disc ratio; HVF humphrey visual field; GVF goldmann visual field; OCT optical coherence tomography; IOP intraocular pressure; BRVO Branch retinal vein occlusion; CRVO central retinal vein occlusion; CRAO central retinal artery occlusion; BRAO branch retinal artery occlusion; RT retinal tear; SB scleral buckle; PPV pars plana vitrectomy; VH Vitreous hemorrhage; PRP panretinal laser photocoagulation; IVK intravitreal kenalog; VMT vitreomacular traction; MH Macular hole;  NVD neovascularization of the disc; NVE neovascularization elsewhere; AREDS age related eye disease study; ARMD age related macular degeneration; POAG primary open angle glaucoma; EBMD epithelial/anterior basement membrane dystrophy; ACIOL anterior chamber intraocular lens; IOL intraocular lens; PCIOL posterior chamber intraocular lens; Phaco/IOL phacoemulsification with intraocular lens placement; [place] photorefractive keratectomy; LASIK laser assisted in situ keratomileusis; HTN hypertension; DM diabetes mellitus; COPD chronic obstructive pulmonary disease

## 2020-08-05 ENCOUNTER — Ambulatory Visit (INDEPENDENT_AMBULATORY_CARE_PROVIDER_SITE_OTHER): Payer: Medicare Other | Admitting: Ophthalmology

## 2020-08-05 ENCOUNTER — Other Ambulatory Visit: Payer: Self-pay

## 2020-08-05 ENCOUNTER — Encounter: Payer: Self-pay | Admitting: Urology

## 2020-08-05 DIAGNOSIS — E119 Type 2 diabetes mellitus without complications: Secondary | ICD-10-CM | POA: Diagnosis not present

## 2020-08-05 DIAGNOSIS — H25813 Combined forms of age-related cataract, bilateral: Secondary | ICD-10-CM

## 2020-08-05 DIAGNOSIS — H3581 Retinal edema: Secondary | ICD-10-CM | POA: Diagnosis not present

## 2020-08-05 DIAGNOSIS — H35371 Puckering of macula, right eye: Secondary | ICD-10-CM

## 2020-08-05 DIAGNOSIS — Z9889 Other specified postprocedural states: Secondary | ICD-10-CM

## 2020-08-05 DIAGNOSIS — H43813 Vitreous degeneration, bilateral: Secondary | ICD-10-CM

## 2020-08-06 ENCOUNTER — Encounter (INDEPENDENT_AMBULATORY_CARE_PROVIDER_SITE_OTHER): Payer: Self-pay | Admitting: Ophthalmology

## 2020-08-26 DIAGNOSIS — E7849 Other hyperlipidemia: Secondary | ICD-10-CM | POA: Diagnosis not present

## 2020-08-26 DIAGNOSIS — I10 Essential (primary) hypertension: Secondary | ICD-10-CM | POA: Diagnosis not present

## 2020-08-27 DIAGNOSIS — M9902 Segmental and somatic dysfunction of thoracic region: Secondary | ICD-10-CM | POA: Diagnosis not present

## 2020-08-27 DIAGNOSIS — M5442 Lumbago with sciatica, left side: Secondary | ICD-10-CM | POA: Diagnosis not present

## 2020-08-27 DIAGNOSIS — M546 Pain in thoracic spine: Secondary | ICD-10-CM | POA: Diagnosis not present

## 2020-08-27 DIAGNOSIS — M9905 Segmental and somatic dysfunction of pelvic region: Secondary | ICD-10-CM | POA: Diagnosis not present

## 2020-08-27 DIAGNOSIS — M9903 Segmental and somatic dysfunction of lumbar region: Secondary | ICD-10-CM | POA: Diagnosis not present

## 2020-09-02 DIAGNOSIS — E119 Type 2 diabetes mellitus without complications: Secondary | ICD-10-CM | POA: Diagnosis not present

## 2020-09-02 DIAGNOSIS — Z1389 Encounter for screening for other disorder: Secondary | ICD-10-CM | POA: Diagnosis not present

## 2020-09-02 DIAGNOSIS — G4733 Obstructive sleep apnea (adult) (pediatric): Secondary | ICD-10-CM | POA: Diagnosis not present

## 2020-09-02 DIAGNOSIS — M1991 Primary osteoarthritis, unspecified site: Secondary | ICD-10-CM | POA: Diagnosis not present

## 2020-09-02 DIAGNOSIS — E7849 Other hyperlipidemia: Secondary | ICD-10-CM | POA: Diagnosis not present

## 2020-09-02 DIAGNOSIS — R7309 Other abnormal glucose: Secondary | ICD-10-CM | POA: Diagnosis not present

## 2020-09-02 DIAGNOSIS — Z0001 Encounter for general adult medical examination with abnormal findings: Secondary | ICD-10-CM | POA: Diagnosis not present

## 2020-09-02 DIAGNOSIS — I10 Essential (primary) hypertension: Secondary | ICD-10-CM | POA: Diagnosis not present

## 2020-09-25 DIAGNOSIS — I10 Essential (primary) hypertension: Secondary | ICD-10-CM | POA: Diagnosis not present

## 2020-09-25 DIAGNOSIS — E7849 Other hyperlipidemia: Secondary | ICD-10-CM | POA: Diagnosis not present

## 2020-10-13 ENCOUNTER — Encounter: Payer: Self-pay | Admitting: Internal Medicine

## 2020-10-13 ENCOUNTER — Other Ambulatory Visit: Payer: Self-pay

## 2020-10-13 ENCOUNTER — Ambulatory Visit: Payer: Medicare Other | Admitting: Internal Medicine

## 2020-10-13 VITALS — BP 116/58 | HR 86 | Ht 71.0 in | Wt 252.6 lb

## 2020-10-13 DIAGNOSIS — M461 Sacroiliitis, not elsewhere classified: Secondary | ICD-10-CM | POA: Diagnosis not present

## 2020-10-13 DIAGNOSIS — I10 Essential (primary) hypertension: Secondary | ICD-10-CM | POA: Diagnosis not present

## 2020-10-13 NOTE — Patient Instructions (Signed)
Medication Instructions:  Your physician recommends that you continue on your current medications as directed. Please refer to the Current Medication list given to you today.  *If you need a refill on your cardiac medications before your next appointment, please call your pharmacy*   Lab Work: None If you have labs (blood work) drawn today and your tests are completely normal, you will receive your results only by: [place] (if you have MyChart) OR A paper copy in the mail If you have any lab test that is abnormal or we need to change your treatment, we will call you to review the results.   Testing/Procedures: None   Follow-Up: At [facility], you and your health needs are our priority.  As part of our continuing mission to provide you with exceptional heart care, we have created designated Provider Care Teams.  These Care Teams include your primary Cardiologist (physician) and Advanced Practice Providers (APPs -  Physician Assistants and Nurse Practitioners) who all work together to provide you with the care you need, when you need it.  We recommend signing up for the patient portal called "MyChart".  Sign up information is provided on this After Visit Summary.  MyChart is used to connect with patients for Virtual Visits (Telemedicine).  Patients are able to view lab/test results, encounter notes, upcoming appointments, etc.  Non-urgent messages can be sent to your provider as well.   To learn more about what you can do with MyChart, go to [email or web address].    Your next appointment:   May 2023   Other Instructions

## 2020-10-13 NOTE — Progress Notes (Signed)
Cardiology Office Note   Date:  10/13/2020   ID:  [name], DOB October 13, 1950, MRN [id number]  PCP:  Lamar Munch, PA-C  Cardiologist:   Dorris Carnes, MD    Pt presents for evaluation of    History of Present Illness: Malik Bowen is a 70 y.o. male with a history of HTN and HL (LDL in May 2021 77)  Last seen in cardiology clinic in June 2021   Referred for CP and HTN   Pt very active, walking 12-13000 steps per day  CP was with activity   REcommm a CT coronary angiogram  Calcium score was 17   No sigificant CAD   Since seen the pt has done well   he is very active   Bikes in neighborhood No CP  Breathing is OK  No dizzienss Recently seen in IM clinic   Told he could stop HCTZ  He said that edema developed so he went back on meds   Diet:   Breakfast:   Egg/cheese Lunch   Salad Dinner   [employer]      Current Meds  Medication Sig   atorvastatin (LIPITOR) 40 MG tablet Take 40 mg by mouth daily.   celecoxib (CELEBREX) 200 MG capsule Take 200 mg by mouth as needed.    hydrochlorothiazide (HYDRODIURIL) 25 MG tablet Take 25 mg by mouth daily.   metFORMIN (GLUCOPHAGE) 500 MG tablet Take 500 mg by mouth daily with breakfast.    olmesartan (BENICAR) 40 MG tablet Take 40 mg by mouth daily.     Allergies:   Other   Past Medical History:  Diagnosis Date   Diabetes mellitus without complication ([place])    Hyperlipidemia    Hypertension     Past Surgical History:  Procedure Laterality Date   EYE SURGERY     LASIK       Social History:  The patient  reports that he has never smoked. He has never used smokeless tobacco. He reports that he does not drink alcohol and does not use drugs.   Family History:  The patient's family history includes Heart failure in his brother and father; Kidney failure in his brother; Prostate cancer in his brother.    ROS:  Please see the history of present illness. All other systems are reviewed and  Negative to the above problem except as  noted.    PHYSICAL EXAM: VS:  BP (!) 116/58   Pulse 86   Ht 5\' 11"  (1.803 m)   Wt 252 lb 9.6 oz (114.6 kg)   SpO2 97%   BMI 35.23 kg/m   GEN:  Obese 70 yo in no acute distress  HEENT: normal  Neck: no JVD, carotid bruits Cardiac: RRR; no murmurs, rubs, or gallops,no  LE edema  Respiratory:  clear to auscultation bilaterally, normal work of breathing GI: soft, nontender,distended, + BS  No hepatomegaly  MS: no deformity Moving all extremities   Skin: warm and dry, no rash Neuro:  Strength and sensation are intact Psych: euthymic mood, full affect   EKG:  EKG is ordered today.  SR82 bpm  Possible IWMI    CT coronary angiogram   10/03/19  FINDINGS: Coronary calcium score: The patient's coronary artery calcium score is 17, which places the patient in the 47th percentile.   Coronary arteries: Normal coronary origins.  Right dominance.   Right Coronary Artery: Large caliber vessel, gives rise to PDA. No significant plaque or stenosis.   Left  Main Coronary Artery: Normal caliber vessel. No significant plaque or stenosis.   Left Anterior Descending Coronary Artery: Normal caliber vessel. Small amounts of focal mixed calcified and noncalcified plaque in proximal and mid LAD with maximum 1-24% stenosis. Gives rise to 2 diagonal branches.   Left Circumflex Artery: Small caliber vessel with short course. No significant plaque or stenosis. Gives rise to 1 small OM branches.   Aorta: Borderline enlarged size, 37 mm at the mid ascending aorta (level of the PA bifurcation) measured double oblique. Scattered diffuse calcifications. No dissection.   Aortic Valve: Annular calcifications. Trileaflet.   Other findings:   Normal pulmonary vein drainage into the left atrium.   Normal left atrial appendage without a thrombus.   Normal size of the pulmonary artery.   IMPRESSION: 1.  Minimal nonobstructive CAD, CADRADS = 1.   2. Coronary calcium score of 17. This was 47th  percentile for age and sex matched control.   3.  Normal coronary origin with right dominance.   4.  Aortic atherosclerosis.     Electronically Signed   By: Buford Dresser M.D.   On: 10/04/2019 17:35 Lipid Panel No results found for: CHOL, TRIG, HDL, CHOLHDL, VLDL, LDLCALC, LDLDIRECT    Wt Readings from Last 3 Encounters:  10/13/20 252 lb 9.6 oz (114.6 kg)  05/28/20 250 lb (113.4 kg)  04/10/20 246 lb (111.6 kg)      ASSESSMENT AND PLAN:  1  Chest pain    Pt denies pain   Very active    2  HTN   BP is OK  I would try cutting HCTZ in 1/2 and following BP   IF OK then continue     3  HLContinue statin   LDL will need to be followed    4  DM  Continue oral agent He has become sensitive (GI ) to Metform  Pulling back some     Current medicines are reviewed at length with the patient today.  The patient does not have concerns regarding medicines.  Signed, Dorris Carnes, MD  10/13/2020 1:29 PM    [facility] [address], [place], [place]  [address] Phone: [phone]; Fax: [phone]

## 2020-10-15 ENCOUNTER — Encounter: Payer: Medicare Other | Attending: Family Medicine | Admitting: Nutrition

## 2020-10-15 ENCOUNTER — Other Ambulatory Visit: Payer: Self-pay

## 2020-10-15 VITALS — Ht 71.0 in | Wt 232.0 lb

## 2020-10-15 DIAGNOSIS — E118 Type 2 diabetes mellitus with unspecified complications: Secondary | ICD-10-CM | POA: Diagnosis not present

## 2020-10-15 DIAGNOSIS — E669 Obesity, unspecified: Secondary | ICD-10-CM | POA: Diagnosis present

## 2020-10-15 DIAGNOSIS — IMO0002 Reserved for concepts with insufficient information to code with codable children: Secondary | ICD-10-CM

## 2020-10-15 DIAGNOSIS — E1165 Type 2 diabetes mellitus with hyperglycemia: Secondary | ICD-10-CM | POA: Diagnosis not present

## 2020-10-15 DIAGNOSIS — E7800 Pure hypercholesterolemia, unspecified: Secondary | ICD-10-CM | POA: Insufficient documentation

## 2020-10-15 NOTE — Progress Notes (Signed)
Medical Nutrition Therapy  Appointment Start time:  4431  Appointment End time:  1430  Primary concerns today: Dm Type 2  A1C 6.2% Referral diagnosis: e11. Preferred learning style: no preference indicated Learning readiness: changes in progess.  NUTRITION ASSESSMENT  A1C 6.2% with Metformin 500 mg once a day. Wants to lose some weight and control his Dm without medication. He usually eats 2 meals a day. Skips lunch. Trys to eat SF things. Anthropometrics   Wt Readings from Last 3 Encounters:  10/15/20 232 lb (105.2 kg)  10/13/20 252 lb 9.6 oz (114.6 kg)  05/28/20 250 lb (113.4 kg)   Ht Readings from Last 3 Encounters:  10/15/20 5\' 11"  (1.803 m)  10/13/20 5\' 11"  (1.803 m)  05/28/20 5\' 11"  (1.803 m)   Body mass index is 32.36 kg/m. @BMIFA @ Facility age limit for growth percentiles is 20 years. Facility age limit for growth percentiles is 20 years.  Clinical Medical Hx: BPH, High cholesterol, arthritis, HTN,  Medications: Metformin 500 mg daily. Labs:  CMP Latest Ref Rng & Units 09/14/2019 03/12/2017  Glucose 70 - 99 mg/dL 122(H) 123(H)  BUN 8 - 23 mg/dL 28(H) 21(H)  Creatinine 0.61 - 1.24 mg/dL 1.19 0.91  Sodium 135 - 145 mmol/L 140 135  Potassium 3.5 - 5.1 mmol/L 4.0 3.4(L)  Chloride 98 - 111 mmol/L 103 103  CO2 22 - 32 mmol/L 25 26  Calcium 8.9 - 10.3 mg/dL 9.4 8.4(L)   A1C 6.2%.  Notable Signs/Symptoms: none  Lifestyle & Dietary Hx LIves with his wife. He enjoys the outdoors riding his bike. He cooks a outdoors. Usually eats 2 meals a day. Tries to eat SF desserts or ice cream if he is going to splurge. Fairly active. Wants to lose some weight and get off Metformin.  Estimated daily fluid intake: 64 oz Supplements: none Sleep: good Stress / self-care: fair Current average weekly physical activity: walks some, busy in  yard  24-Hr Dietary Recall First Meal: Eggs, cheese and hot sauce Snack: mixed nuts Second Meal: chicken salad on spring mix with  ranch/vadalian onion or [place], water Snack: Diet soda, bubbly Third Meal: Small BBQ pork, green beans and coleslaw, water Snack: grapes or carb smart ice cream bar Beverages: water  Estimated Energy Needs Calories: 1800 Carbohydrate: 200 g Protein: 135g Fat: 50g   NUTRITION DIAGNOSIS  NB-1.1 Food and nutrition-related knowledge deficit As related to Dm Type 2.  As evidenced by A1C 6.2% and on Metformin 500 mg a day.Marland Kitchen   NUTRITION INTERVENTION  Nutrition education (E-1) on the following topics:  Nutrition and Diabetes education provided on My Plate, CHO counting, meal planning, portion sizes, timing of meals, avoiding snacks between meals unless having a low blood sugar, target ranges for A1C and blood sugars, signs/symptoms and treatment of hyper/hypoglycemia, monitoring blood sugars, taking medications as prescribed, benefits of exercising 30 minutes per day and prevention of complications of DM.    Handouts Provided Include  My Plate Meal Plan Card Diabetes Instructions.   Learning Style & Readiness for Change Teaching method utilized: Visual & Auditory  Demonstrated degree of understanding via: Teach Back  Barriers to learning/adherence to lifestyle change: non  Goals Established by Pt Eat three meals per day Follow MY Plate Eat 54-00 g CHO at each meal Do not skip meals Increase lower carb vegetables. Avoid SF products and processed foods Focus on more plant based foods. Check out website: www.lifestylemedicine.org  Test blood sugars a few times per week in am and  before bed. Goals FBS less than 130  mg/dl and Bedtime less than 150 mg/dl.  MONITORING & EVALUATION Dietary intake, weekly physical activity, and weight/blood sugars in 1 month.  Next Steps  Patient is to work on eating three meals per day.

## 2020-10-27 ENCOUNTER — Encounter: Payer: Self-pay | Admitting: Nutrition

## 2020-10-27 NOTE — Patient Instructions (Signed)
  Goals Established by Pt Eat three meals per day Follow MY Plate Eat [id number] g CHO at each meal Do not skip meals Increase lower carb vegetables. Avoid SF products and processed foods Focus on more plant based foods. Check out website: www.lifestylemedicine.org  Test blood sugars a few times per week in am and before bed. Goals FBS less than 130  mg/dl and Bedtime less than 150 mg/dl.

## 2020-11-05 DIAGNOSIS — D1801 Hemangioma of skin and subcutaneous tissue: Secondary | ICD-10-CM | POA: Diagnosis not present

## 2020-11-05 DIAGNOSIS — L720 Epidermal cyst: Secondary | ICD-10-CM | POA: Diagnosis not present

## 2020-11-05 DIAGNOSIS — L821 Other seborrheic keratosis: Secondary | ICD-10-CM | POA: Diagnosis not present

## 2020-11-20 DIAGNOSIS — L720 Epidermal cyst: Secondary | ICD-10-CM | POA: Diagnosis not present

## 2020-11-21 DIAGNOSIS — M461 Sacroiliitis, not elsewhere classified: Secondary | ICD-10-CM | POA: Diagnosis not present

## 2020-12-22 DIAGNOSIS — L905 Scar conditions and fibrosis of skin: Secondary | ICD-10-CM | POA: Diagnosis not present

## 2020-12-22 DIAGNOSIS — L728 Other follicular cysts of the skin and subcutaneous tissue: Secondary | ICD-10-CM | POA: Diagnosis not present

## 2021-01-05 DIAGNOSIS — Z86010 Personal history of colonic polyps: Secondary | ICD-10-CM | POA: Diagnosis not present

## 2021-01-05 DIAGNOSIS — I10 Essential (primary) hypertension: Secondary | ICD-10-CM | POA: Diagnosis not present

## 2021-01-05 DIAGNOSIS — Z800 Family history of malignant neoplasm of digestive organs: Secondary | ICD-10-CM | POA: Diagnosis not present

## 2021-01-16 DIAGNOSIS — M9905 Segmental and somatic dysfunction of pelvic region: Secondary | ICD-10-CM | POA: Diagnosis not present

## 2021-01-16 DIAGNOSIS — M5442 Lumbago with sciatica, left side: Secondary | ICD-10-CM | POA: Diagnosis not present

## 2021-01-16 DIAGNOSIS — M9903 Segmental and somatic dysfunction of lumbar region: Secondary | ICD-10-CM | POA: Diagnosis not present

## 2021-01-16 DIAGNOSIS — M546 Pain in thoracic spine: Secondary | ICD-10-CM | POA: Diagnosis not present

## 2021-01-16 DIAGNOSIS — M9902 Segmental and somatic dysfunction of thoracic region: Secondary | ICD-10-CM | POA: Diagnosis not present

## 2021-01-20 DIAGNOSIS — Z23 Encounter for immunization: Secondary | ICD-10-CM | POA: Diagnosis not present

## 2021-02-02 DIAGNOSIS — L723 Sebaceous cyst: Secondary | ICD-10-CM | POA: Diagnosis not present

## 2021-02-02 DIAGNOSIS — L728 Other follicular cysts of the skin and subcutaneous tissue: Secondary | ICD-10-CM | POA: Diagnosis not present

## 2021-02-16 ENCOUNTER — Encounter: Payer: Self-pay | Admitting: Nutrition

## 2021-02-16 ENCOUNTER — Other Ambulatory Visit: Payer: Self-pay

## 2021-02-16 ENCOUNTER — Encounter: Payer: Medicare Other | Attending: Physician Assistant | Admitting: Nutrition

## 2021-02-16 VITALS — Ht 72.0 in | Wt 251.0 lb

## 2021-02-16 DIAGNOSIS — E7800 Pure hypercholesterolemia, unspecified: Secondary | ICD-10-CM | POA: Insufficient documentation

## 2021-02-16 DIAGNOSIS — E118 Type 2 diabetes mellitus with unspecified complications: Secondary | ICD-10-CM | POA: Diagnosis not present

## 2021-02-16 DIAGNOSIS — E669 Obesity, unspecified: Secondary | ICD-10-CM | POA: Insufficient documentation

## 2021-02-16 DIAGNOSIS — M461 Sacroiliitis, not elsewhere classified: Secondary | ICD-10-CM | POA: Diagnosis not present

## 2021-02-16 NOTE — Patient Instructions (Signed)
Goals  Continue to follow a plant based food Drink a gallon of water Look up [employer]. Keep working out. Follow up with MD about lower BP.

## 2021-02-16 NOTE — Progress Notes (Addendum)
Medical Nutrition Therapy  Appointment Start time:  3419  Appointment End time:  0945  Primary concerns today: Dm Type 2  A1C 6.2% Referral diagnosis: e11. Preferred learning style: no preference indicated Learning readiness: changes in progess.  NUTRITION ASSESSMENT  Lost almost 2 lbs CHnages he has made: going to [facility] 2-3 times per week. Doing cardio and training. Feels stronger and more tone. Is riding his bike also. Has been listening some natural healing podcasts. Has been eating a lot less of inflammatory. Eating smaller portions. Has gotten rid of processed foods.  FBS: went from 146 down to 125 in am. Eating a lot more dark leafy greens. Has been checking BP is 106/60.   A1C 6.2% with Metformin 500 mg once a day. Wants to lose some weight and control his Dm without medication. He usually eats 2 meals a day. Skips lunch. Trys to eat SF things. Anthropometrics   Wt Readings from Last 3 Encounters:  10/15/20 232 lb (105.2 kg)  10/13/20 252 lb 9.6 oz (114.6 kg)  05/28/20 250 lb (113.4 kg)   Ht Readings from Last 3 Encounters:  10/15/20 5\' 11"  (1.803 m)  10/13/20 5\' 11"  (1.803 m)  05/28/20 5\' 11"  (1.803 m)   There is no height or weight on file to calculate BMI. @BMIFA @ Facility age limit for growth percentiles is 20 years. Facility age limit for growth percentiles is 20 years.  Clinical Medical Hx: BPH, High cholesterol, arthritis, HTN,  Medications: Metformin 500 mg daily. Labs:  CMP Latest Ref Rng & Units 09/14/2019 03/12/2017  Glucose 70 - 99 mg/dL 122(H) 123(H)  BUN 8 - 23 mg/dL 28(H) 21(H)  Creatinine 0.61 - 1.24 mg/dL 1.19 0.91  Sodium 135 - 145 mmol/L 140 135  Potassium 3.5 - 5.1 mmol/L 4.0 3.4(L)  Chloride 98 - 111 mmol/L 103 103  CO2 22 - 32 mmol/L 25 26  Calcium 8.9 - 10.3 mg/dL 9.4 8.4(L)   A1C 6.2%.  Notable Signs/Symptoms: none  Lifestyle & Dietary Hx LIves with his wife. He enjoys the outdoors riding his bike. He cooks a outdoors. Usually  eats 2 meals a day. Tries to eat SF desserts or ice cream if he is going to splurge. Fairly active. Wants to lose some weight and get off Metformin.  Estimated daily fluid intake: 64 oz Supplements: none Sleep: good Stress / self-care: fair Current average weekly physical activity: walks some, busy in  yard  24-Hr Dietary Recall First Meal: Eggs, cheese and hot sauce Snack: mixed nuts Second Meal: chicken salad on spring mix with ranch/vadalian onion or [place], water Snack: Diet soda, bubbly Third Meal: Small BBQ pork, green beans and coleslaw, water Snack: grapes or carb smart ice cream bar Beverages: water  Estimated Energy Needs Calories: 1800 Carbohydrate: 200 g Protein: 135g Fat: 50g   NUTRITION DIAGNOSIS  NB-1.1 Food and nutrition-related knowledge deficit As related to Dm Type 2.  As evidenced by A1C 6.2% and on Metformin 500 mg a day.Marland Kitchen   NUTRITION INTERVENTION  Nutrition education (E-1) on the following topics:  Lifestyle Medicine - Whole Food, Plant Predominant Nutrition is highly recommended: Eat Plenty of vegetables, Mushrooms, fruits, Legumes, Whole Grains, Nuts, seeds in lieu of processed meats, processed snacks/pastries red meat, poultry, eggs.    -It is better to avoid simple carbohydrates including: Cakes, Sweet Desserts, Ice Cream, Soda (diet and regular), Sweet Tea, Candies, Chips, Cookies, Store Bought Juices, Alcohol in Excess of  1-2 drinks a day, Lemonade,  Artificial Sweeteners, Doughnuts, Coffee  Creamers, "Sugar-free" Products, etc, etc.  This is not a complete list.....  Exercise: If you are able: 30 -60 minutes a day ,4 days a week, or 150 minutes a week.  The longer the better.  Combine stretch, strength, and aerobic activities.  If you were told in the past that you have high risk for cardiovascular diseases, you may seek evaluation by your heart doctor prior to initiating moderate to intense exercise programs.  Handouts Provided Include  My  Plate Meal Plan Card Diabetes Instructions.   Learning Style & Readiness for Change Teaching method utilized: Visual & Auditory  Demonstrated degree of understanding via: Teach Back  Barriers to learning/adherence to lifestyle change: non  Goals Established by Pt Eat three meals per day Follow MY Plate Eat 67-20 g CHO at each meal Do not skip meals Increase lower carb vegetables. Avoid SF products and processed foods Focus on more plant based foods. Check out website: www.lifestylemedicine.org  Test blood sugars a few times per week in am and before bed. Goals FBS less than 130  mg/dl and Bedtime less than 150 mg/dl.   Lifestyle Medicine - Whole Food, Plant Predominant Nutrition is highly recommended: Eat Plenty of vegetables, Mushrooms, fruits, Legumes, Whole Grains, Nuts, seeds in lieu of processed meats, processed snacks/pastries red meat, poultry, eggs.    -It is better to avoid simple carbohydrates including: Cakes, Sweet Desserts, Ice Cream, Soda (diet and regular), Sweet Tea, Candies, Chips, Cookies, Store Bought Juices, Alcohol in Excess of  1-2 drinks a day, Lemonade,  Artificial Sweeteners, Doughnuts, Coffee Creamers, "Sugar-free" Products, etc, etc.  This is not a complete list.....  Exercise: If you are able: 30 -60 minutes a day ,4 days a week, or 150 minutes a week.  The longer the better.  Combine stretch, strength, and aerobic activities.  If you were told in the past that you have high risk for cardiovascular diseases, you may seek evaluation by your heart doctor prior to initiating moderate to intense exercise programs.  MONITORING & EVALUATION Dietary intake, weekly physical activity, and weight/blood sugars in 1 month.  Next Steps  Patient is to work on eating three meals per day.

## 2021-02-23 DIAGNOSIS — M9905 Segmental and somatic dysfunction of pelvic region: Secondary | ICD-10-CM | POA: Diagnosis not present

## 2021-02-23 DIAGNOSIS — M5442 Lumbago with sciatica, left side: Secondary | ICD-10-CM | POA: Diagnosis not present

## 2021-02-23 DIAGNOSIS — M9906 Segmental and somatic dysfunction of lower extremity: Secondary | ICD-10-CM | POA: Diagnosis not present

## 2021-02-23 DIAGNOSIS — M9902 Segmental and somatic dysfunction of thoracic region: Secondary | ICD-10-CM | POA: Diagnosis not present

## 2021-02-23 DIAGNOSIS — M546 Pain in thoracic spine: Secondary | ICD-10-CM | POA: Diagnosis not present

## 2021-02-23 DIAGNOSIS — M25572 Pain in left ankle and joints of left foot: Secondary | ICD-10-CM | POA: Diagnosis not present

## 2021-02-23 DIAGNOSIS — M9903 Segmental and somatic dysfunction of lumbar region: Secondary | ICD-10-CM | POA: Diagnosis not present

## 2021-02-24 DIAGNOSIS — K635 Polyp of colon: Secondary | ICD-10-CM | POA: Diagnosis not present

## 2021-02-24 DIAGNOSIS — D124 Benign neoplasm of descending colon: Secondary | ICD-10-CM | POA: Diagnosis not present

## 2021-02-24 DIAGNOSIS — Z86010 Personal history of colonic polyps: Secondary | ICD-10-CM | POA: Diagnosis not present

## 2021-02-24 DIAGNOSIS — D122 Benign neoplasm of ascending colon: Secondary | ICD-10-CM | POA: Diagnosis not present

## 2021-02-24 DIAGNOSIS — K5730 Diverticulosis of large intestine without perforation or abscess without bleeding: Secondary | ICD-10-CM | POA: Diagnosis not present

## 2021-03-02 ENCOUNTER — Ambulatory Visit (INDEPENDENT_AMBULATORY_CARE_PROVIDER_SITE_OTHER): Payer: Medicare Other

## 2021-03-02 ENCOUNTER — Other Ambulatory Visit: Payer: Self-pay

## 2021-03-02 ENCOUNTER — Ambulatory Visit: Payer: Medicare Other | Admitting: Podiatry

## 2021-03-02 DIAGNOSIS — M76822 Posterior tibial tendinitis, left leg: Secondary | ICD-10-CM

## 2021-03-02 NOTE — Progress Notes (Signed)
   HPI: 70 y.o. male presenting today for evaluation of left foot and ankle pain.  Patient states that he has been experiencing some left foot pain for quite some time now.  Back in 2021 he was molded for custom molded orthotics but he says the orthotics were very uncomfortable.  He states that his toes would slide in the forefoot and jam his feet.  He continues to have some left foot and arch pain.  He presents for further treatment and evaluation  Past Medical History:  Diagnosis Date   Diabetes mellitus without complication ([place])    Hyperlipidemia    Hypertension      Physical Exam: General: The patient is alert and oriented x3 in no acute distress.  Dermatology: Skin is warm, dry and supple bilateral lower extremities. Negative for open lesions or macerations.  Vascular: Palpable pedal pulses bilaterally. No edema or erythema noted. Capillary refill within normal limits.  Neurological: Epicritic and protective threshold grossly intact bilaterally.   Musculoskeletal Exam: Range of motion within normal limits to all pedal and ankle joints bilateral. Muscle strength 5/5 in all groups bilateral.  Pain on palpation throughout the medial longitudinal arch of the foot  Radiographic Exam:  Normal osseous mineralization.  Diffuse degenerative changes noted consistent with arthritis/DJD.  No fracture/dislocation/or bony destruction.  Assessment: 1.  Posterior tibial tendinitis left 2.  Diffuse generalized arthritis/DJD left   Plan of Care:  1. Patient evaluated. X-Rays reviewed.  2.  Patient declined injection today 3.  Continue Celebrex 100 mg 2 times daily as per prescribing physician 4.  Unfortunately patient was not satisfied with the previous orthotics that he purchased.  Today were going to arrange an appointment with the Pedorthist for a new pair of custom molded orthotics to see if this helps alleviate his pain and supports the arches of his feet 5.  Return to clinic as needed       Malik Bowen, DPM [facility]  Dr. Edrick Bowen, DPM    [address], [place] [address]                Office [phone]  Fax [phone]

## 2021-03-09 ENCOUNTER — Other Ambulatory Visit: Payer: Self-pay

## 2021-03-09 ENCOUNTER — Ambulatory Visit: Payer: Medicare Other

## 2021-03-09 DIAGNOSIS — M76822 Posterior tibial tendinitis, left leg: Secondary | ICD-10-CM

## 2021-03-09 DIAGNOSIS — G5762 Lesion of plantar nerve, left lower limb: Secondary | ICD-10-CM

## 2021-03-09 DIAGNOSIS — M79672 Pain in left foot: Secondary | ICD-10-CM

## 2021-03-09 NOTE — Progress Notes (Signed)
SITUATION Reason for Consult: Evaluation for Bilateral Custom Foot Orthoses Patient / Caregiver Report: Patient's existing insoles are not working  OBJECTIVE DATA: Patient History / Diagnosis: Posterior tibial tendinitis, left  Morton's neuroma, left  Left foot pain  Current or Previous Devices: Custom foot orthotics  Foot Examination: Skin presentation:   Intact Ulcers & Callousing:   None and no history Toe / Foot Deformities:  Pes planus Weight Bearing Presentation:  Planus Sensation:    Intact  ORTHOTIC RECOMMENDATION Recommended Device: 1x pair of custom functional foot orthotics  GOALS OF ORTHOSES - Reduce Pain - Prevent Foot Deformity - Prevent Progression of Further Foot Deformity - Relieve Pressure - Improve the Overall Biomechanical Function of the Foot and Lower Extremity.  ACTIONS PERFORMED Patient was casted for Foot Orthoses via crush box. Procedure was explained and patient tolerated procedure well. All questions were answered and concerns addressed.  PLAN Potential out of pocket cost was communicated to patient. Casts are to be sent to [facility] for fabrication. Patient is to be called for fitting when devices are ready.

## 2021-04-13 ENCOUNTER — Ambulatory Visit: Payer: Medicare Other

## 2021-04-13 ENCOUNTER — Other Ambulatory Visit: Payer: Self-pay

## 2021-04-13 DIAGNOSIS — M79672 Pain in left foot: Secondary | ICD-10-CM

## 2021-04-13 DIAGNOSIS — G5762 Lesion of plantar nerve, left lower limb: Secondary | ICD-10-CM

## 2021-04-13 DIAGNOSIS — M76822 Posterior tibial tendinitis, left leg: Secondary | ICD-10-CM

## 2021-04-13 NOTE — Progress Notes (Signed)
SITUATION: Reason for Visit: Fitting and Delivery of Custom Fabricated Foot Orthoses Patient Report: Patient reports comfort and is satisfied with device.  OBJECTIVE DATA: Patient History / Diagnosis:     ICD-10-CM   1. Posterior tibial tendinitis, left  M76.822     2. Morton's neuroma, left  G57.62     3. Left foot pain  M79.672       Provided Device:  Custom functional foot orthotics  GOAL OF ORTHOSIS - Improve gait - Decrease energy expenditure - Improve Balance - Provide Triplanar stability of foot complex - Facilitate motion  ACTIONS PERFORMED Patient was fit with foot orthotics trimmed to shoe last. Patient tolerated fittign procedure. Device was modified as follows to better fit patient: - Toe plate was trimmed to shoe last  Patient was provided with verbal and written instruction and demonstration regarding donning, doffing, wear, care, proper fit, function, purpose, cleaning, and use of the orthosis and in all related precautions and risks and benefits regarding the orthosis.  Patient was also provided with verbal instruction regarding how to report any failures or malfunctions of the orthosis and necessary follow up care. Patient was also instructed to contact our office regarding any change in status that may affect the function of the orthosis.  Patient demonstrated independence with proper donning, doffing, and fit and verbalized understanding of all instructions.  PLAN: Patient is to follow up in one week or as necessary (PRN). All questions were answered and concerns addressed. Plan of care was discussed with and agreed upon by the patient.

## 2021-04-22 DIAGNOSIS — M25512 Pain in left shoulder: Secondary | ICD-10-CM | POA: Diagnosis not present

## 2021-05-08 ENCOUNTER — Telehealth: Payer: Self-pay | Admitting: Internal Medicine

## 2021-05-08 DIAGNOSIS — R000 Tachycardia, unspecified: Secondary | ICD-10-CM

## 2021-05-08 DIAGNOSIS — R5383 Other fatigue: Secondary | ICD-10-CM

## 2021-05-08 DIAGNOSIS — R42 Dizziness and giddiness: Secondary | ICD-10-CM

## 2021-05-08 NOTE — Telephone Encounter (Signed)
STAT if HR is under 50 or over 120 (normal HR is 60-100 beats per minute)  What is your heart rate? 80-85  Do you have a log of your heart rate readings (document readings)?    Do you have any other symptoms? Lightheadedness, dizzy, and Sob

## 2021-05-08 NOTE — Telephone Encounter (Signed)
Building outside today and came inside with HRs in the 130s. Checked BP 96/59.  Usually avg 115-117/65 Avg HRs 85 Light headed, no energy when he gets up and moves. He does typically ride a bike and HRs will go into 120-125s up/down hills. [place] and has no issues on the treadmill. Resting HRs 60-70s. Keeps burping  His HRs don't go this high, and he keeps track via his samsung watch.  Will route to MD and nurse to address next week. Patient verbalized understanding and agreeable to plan.  Aware that it may be end of next week before return call.

## 2021-05-10 NOTE — Telephone Encounter (Signed)
Pt needs labs :  CBC, CMET, TSH Would set up for Zio patch  48 hour Would get EKG and BP when comes in If BP still low stop Olmesartan Needs to have follow up appt

## 2021-05-11 ENCOUNTER — Ambulatory Visit (INDEPENDENT_AMBULATORY_CARE_PROVIDER_SITE_OTHER): Payer: Medicare Other

## 2021-05-11 DIAGNOSIS — M9902 Segmental and somatic dysfunction of thoracic region: Secondary | ICD-10-CM | POA: Diagnosis not present

## 2021-05-11 DIAGNOSIS — M5442 Lumbago with sciatica, left side: Secondary | ICD-10-CM | POA: Diagnosis not present

## 2021-05-11 DIAGNOSIS — R5383 Other fatigue: Secondary | ICD-10-CM

## 2021-05-11 DIAGNOSIS — R000 Tachycardia, unspecified: Secondary | ICD-10-CM

## 2021-05-11 DIAGNOSIS — R42 Dizziness and giddiness: Secondary | ICD-10-CM

## 2021-05-11 DIAGNOSIS — M546 Pain in thoracic spine: Secondary | ICD-10-CM | POA: Diagnosis not present

## 2021-05-11 DIAGNOSIS — M9905 Segmental and somatic dysfunction of pelvic region: Secondary | ICD-10-CM | POA: Diagnosis not present

## 2021-05-11 DIAGNOSIS — M9903 Segmental and somatic dysfunction of lumbar region: Secondary | ICD-10-CM | POA: Diagnosis not present

## 2021-05-11 NOTE — Telephone Encounter (Signed)
PT AWARE OF RECOMMENDATIONS  LABS TO BE DONE TOMORROW AND ORDER ENTERED FOR MONITOR PT HAS F/U APPT 05/28/21 AT 4:20 PM WITH DR ROSS

## 2021-05-11 NOTE — Progress Notes (Unsigned)
Enrolled for [employer] to mail a ZIO XT long term holter monitor to the patients address on file.  

## 2021-05-13 DIAGNOSIS — R5383 Other fatigue: Secondary | ICD-10-CM | POA: Diagnosis not present

## 2021-05-13 DIAGNOSIS — R42 Dizziness and giddiness: Secondary | ICD-10-CM | POA: Diagnosis not present

## 2021-05-14 ENCOUNTER — Other Ambulatory Visit: Payer: Self-pay

## 2021-05-14 DIAGNOSIS — R000 Tachycardia, unspecified: Secondary | ICD-10-CM

## 2021-05-14 DIAGNOSIS — R5383 Other fatigue: Secondary | ICD-10-CM

## 2021-05-14 DIAGNOSIS — R42 Dizziness and giddiness: Secondary | ICD-10-CM

## 2021-05-14 LAB — CBC
Hematocrit: 41.2 % (ref 37.5–51.0)
Hemoglobin: 14.1 g/dL (ref 13.0–17.7)
MCH: 29.3 pg (ref 26.6–33.0)
MCHC: 34.2 g/dL (ref 31.5–35.7)
MCV: 86 fL (ref 79–97)
Platelets: 200 10*3/uL (ref 150–450)
RBC: 4.82 x10E6/uL (ref 4.14–5.80)
RDW: 13.3 % (ref 11.6–15.4)
WBC: 7.3 10*3/uL (ref 3.4–10.8)

## 2021-05-15 DIAGNOSIS — R000 Tachycardia, unspecified: Secondary | ICD-10-CM | POA: Diagnosis not present

## 2021-05-15 DIAGNOSIS — R5383 Other fatigue: Secondary | ICD-10-CM | POA: Diagnosis not present

## 2021-05-15 DIAGNOSIS — R42 Dizziness and giddiness: Secondary | ICD-10-CM | POA: Diagnosis not present

## 2021-05-16 LAB — COMPREHENSIVE METABOLIC PANEL
ALT: 29 IU/L (ref 0–44)
AST: 29 IU/L (ref 0–40)
Albumin/Globulin Ratio: 2.7 — ABNORMAL HIGH (ref 1.2–2.2)
Albumin: 4.1 g/dL (ref 3.8–4.8)
Alkaline Phosphatase: 87 IU/L (ref 44–121)
BUN/Creatinine Ratio: 20 (ref 10–24)
BUN: 25 mg/dL (ref 8–27)
Bilirubin Total: 0.5 mg/dL (ref 0.0–1.2)
CO2: 21 mmol/L (ref 20–29)
Calcium: 9.1 mg/dL (ref 8.6–10.2)
Chloride: 104 mmol/L (ref 96–106)
Creatinine, Ser: 1.24 mg/dL (ref 0.76–1.27)
Globulin, Total: 1.5 g/dL (ref 1.5–4.5)
Glucose: 118 mg/dL — ABNORMAL HIGH (ref 70–99)
Potassium: 4.1 mmol/L (ref 3.5–5.2)
Sodium: 139 mmol/L (ref 134–144)
Total Protein: 5.6 g/dL — ABNORMAL LOW (ref 6.0–8.5)
eGFR: 63 mL/min/{1.73_m2} (ref >59)

## 2021-05-16 LAB — TSH: TSH: 2.66 u[IU]/mL (ref 0.450–4.500)

## 2021-05-19 DIAGNOSIS — M545 Low back pain, unspecified: Secondary | ICD-10-CM | POA: Diagnosis not present

## 2021-05-19 DIAGNOSIS — D690 Allergic purpura: Secondary | ICD-10-CM | POA: Diagnosis not present

## 2021-05-20 ENCOUNTER — Ambulatory Visit (INDEPENDENT_AMBULATORY_CARE_PROVIDER_SITE_OTHER): Payer: Medicare Other

## 2021-05-20 ENCOUNTER — Other Ambulatory Visit: Payer: Self-pay

## 2021-05-20 VITALS — BP 136/70

## 2021-05-20 DIAGNOSIS — I10 Essential (primary) hypertension: Secondary | ICD-10-CM

## 2021-05-20 DIAGNOSIS — R000 Tachycardia, unspecified: Secondary | ICD-10-CM | POA: Diagnosis not present

## 2021-05-20 NOTE — Progress Notes (Signed)
Pt presents today for EKG/BP check- nurse visit. Pt stated that since stopping Olmesartan per Dr. Harrington Challenger, he is feeling better and bp is no higher than upper 130's/70's. Pt does c/o a little chest tightness, but stated that it does not bother him. Pt denies SOB, nausea, etc. Will fwd to DOD and primary cardiologist for review.   EKG in chart

## 2021-05-20 NOTE — Progress Notes (Signed)
EKG is unchanged    BP is OK I would keep on same meds   Follow symptoms   Follow BP Stay hydrated

## 2021-05-25 NOTE — Progress Notes (Signed)
'  ? ?Cardiology Office Note ? ? ?Date:  05/28/2021  ? ?ID:  Malik Bowen, DOB 1950-08-24, MRN [id number] ? ?PCP:  Garnie Munch, PA-C  ?Cardiologist:   Dorris Carnes, MD  ? ? ?Pt presents for f/u   ?  ?History of Present Illness: ?Malik Bowen is a 71 y.o. male with a history of HTN, HL and CP   CT coronary angiogram in 2021 showed Ca score of 17   No signifiant CAD on angiogram    ?I last saw the pt in clinic in July 2022 ?The pt called in on 2/10   Complained of heart rates in the 130s   BP low with this   96/59   Felt dizzy  No energy  ?I recomm labs and a Zio patch      Monitor showed no significant arrhythmias   ? ?The pt denies CP   Says he is breathing OK   Notes no further racing    ?Says he does drink water during day   ? ?Current Meds  ?Medication Sig  ? atorvastatin (LIPITOR) 40 MG tablet Take 40 mg by mouth daily.  ? celecoxib (CELEBREX) 200 MG capsule Take 200 mg by mouth as needed.   ? hydrochlorothiazide (HYDRODIURIL) 25 MG tablet Take 25 mg by mouth daily.  ? metFORMIN (GLUCOPHAGE) 500 MG tablet Take 500 mg by mouth daily with breakfast.   ? ? ? ?Allergies:   Other  ? ?Past Medical History:  ?Diagnosis Date  ? Diabetes mellitus without complication ([place])   ? Hyperlipidemia   ? Hypertension   ? ? ?Past Surgical History:  ?Procedure Laterality Date  ? EYE SURGERY    ? LASIK    ? ? ? ?Social History:  The patient  reports that he has never smoked. He has never used smokeless tobacco. He reports that he does not drink alcohol and does not use drugs.  ? ?Family History:  The patient's family history includes Heart failure in his brother and father; Kidney failure in his brother; Prostate cancer in his brother.  ? ? ?ROS:  Please see the history of present illness. All other systems are reviewed and  Negative to the above problem except as noted.  ? ? ?PHYSICAL EXAM: ?VS:  BP 120/70   Pulse 83   Ht 5\' 11"  (1.803 m)   Wt 252 lb 3.2 oz (114.4 kg)   SpO2 97%   BMI 35.17 kg/m?   ?GEN:  Obese 71 yo in no acute  distress  ?HEENT: normal  ?Neck: no JVD,  ?Cardiac: RRR; no murmurs, rubs, or gallops,no  LE edema  ?Respiratory:  clear to auscultation bilaterally ?GI: soft, nontender,distended, + BS  No hepatomegaly  ?MS: no deformity Moving all extremities   ?Skin: warm and dry, no rash ?Neuro:  Strength and sensation are intact ?Psych: euthymic mood, full affect ? ? ?EKG:  EKG is not ordered  ? ?CT coronary angiogram   10/03/19 ? ?FINDINGS: ?Coronary calcium score: The patient's coronary artery calcium score ?is 17, which places the patient in the 47th percentile. ?  ?Coronary arteries: Normal coronary origins.  Right dominance. ?  ?Right Coronary Artery: Large caliber vessel, gives rise to PDA. No ?significant plaque or stenosis. ?  ?Left Main Coronary Artery: Normal caliber vessel. No significant ?plaque or stenosis. ?  ?Left Anterior Descending Coronary Artery: Normal caliber vessel. ?Small amounts of focal mixed calcified and noncalcified plaque in ?proximal and mid LAD with maximum 1-24% stenosis. Gives  rise to 2 ?diagonal branches. ?  ?Left Circumflex Artery: Small caliber vessel with short course. No ?significant plaque or stenosis. Gives rise to 1 small OM branches. ?  ?Aorta: Borderline enlarged size, 37 mm at the mid ascending aorta ?(level of the PA bifurcation) measured double oblique. Scattered ?diffuse calcifications. No dissection. ?  ?Aortic Valve: Annular calcifications. Trileaflet. ?  ?Other findings: ?  ?Normal pulmonary vein drainage into the left atrium. ?  ?Normal left atrial appendage without a thrombus. ?  ?Normal size of the pulmonary artery. ?  ?IMPRESSION: ?1.  Minimal nonobstructive CAD, CADRADS = 1. ?  ?2. Coronary calcium score of 17. This was 47th percentile for age ?and sex matched control. ?  ?3.  Normal coronary origin with right dominance. ?  ?4.  Aortic atherosclerosis. ?  ?  ?Electronically Signed ?  By: Buford Dresser M.D. ?  On: 10/04/2019 17:35 ?Lipid Panel ?No results found for:  CHOL, TRIG, HDL, CHOLHDL, VLDL, LDLCALC, LDLDIRECT ?  ? ?Wt Readings from Last 3 Encounters:  ?05/28/21 252 lb 3.2 oz (114.4 kg)  ?02/16/21 251 lb (113.9 kg)  ?10/15/20 232 lb (105.2 kg)  ?  ? ? ?ASSESSMENT AND PLAN: ? ?1 Palpitations   Reviewed monitor  No significant arrythmias detected    ?Follow for now   Stay hydrated    ? ?2  Hx HTN   Pt has had a drop in needs    ? If some of symptoms due to inadequate hydration  (loading conditions) with BP meds     ?Encouraged him to stay hydrated   Can cut back on olmesartan and follow BP   ? ?3  HLContinue statin   LDL 67   HDL 58 (June 2022) ? ?4  DM  Last A1C was 6.2   Counselled on diet     ? ?Current medicines are reviewed at length with the patient today.  The patient does not have concerns regarding medicines. ? ?Signed, ?Dorris Carnes, MD  ?05/28/2021 4:36 PM    ?[facility] ?[place], [place], [place]  [address] ?Phone: [phone]; Fax: [phone]  ? ? ?

## 2021-05-26 DIAGNOSIS — R42 Dizziness and giddiness: Secondary | ICD-10-CM | POA: Diagnosis not present

## 2021-05-26 DIAGNOSIS — R000 Tachycardia, unspecified: Secondary | ICD-10-CM | POA: Diagnosis not present

## 2021-05-26 DIAGNOSIS — R5383 Other fatigue: Secondary | ICD-10-CM | POA: Diagnosis not present

## 2021-05-28 ENCOUNTER — Ambulatory Visit: Payer: Medicare Other | Admitting: Internal Medicine

## 2021-05-28 ENCOUNTER — Encounter: Payer: Self-pay | Admitting: Internal Medicine

## 2021-05-28 ENCOUNTER — Other Ambulatory Visit: Payer: Self-pay

## 2021-05-28 VITALS — BP 120/70 | HR 83 | Ht 71.0 in | Wt 252.2 lb

## 2021-05-28 DIAGNOSIS — I10 Essential (primary) hypertension: Secondary | ICD-10-CM | POA: Diagnosis not present

## 2021-05-28 MED ORDER — OLMESARTAN MEDOXOMIL 20 MG PO TABS: 20.0000 mg | ORAL_TABLET | Freq: Every day | ORAL | 3 refills | Status: DC

## 2021-05-28 NOTE — Patient Instructions (Signed)
Medication Instructions:  ? ?Decrease Olmesartan to 20 mg Daily  ? ?*If you need a refill on your cardiac medications before your next appointment, please call your pharmacy* ? ? ?Lab Work: ?NONE  ? ?If you have labs (blood work) drawn today and your tests are completely normal, you will receive your results only by: ?MyChart Message (if you have MyChart) OR ?A paper copy in the mail ?If you have any lab test that is abnormal or we need to change your treatment, we will call you to review the results. ? ? ?Testing/Procedures: ?NONE  ? ? ?Follow-Up: ?At [facility], you and your health needs are our priority.  As part of our continuing mission to provide you with exceptional heart care, we have created designated Provider Care Teams.  These Care Teams include your primary Cardiologist (physician) and Advanced Practice Providers (APPs -  Physician Assistants and Nurse Practitioners) who all work together to provide you with the care you need, when you need it. ? ?We recommend signing up for the patient portal called "MyChart".  Sign up information is provided on this After Visit Summary.  MyChart is used to connect with patients for Virtual Visits (Telemedicine).  Patients are able to view lab/test results, encounter notes, upcoming appointments, etc.  Non-urgent messages can be sent to your provider as well.   ?To learn more about what you can do with MyChart, go to [email or web address].   ? ?Your next appointment:   ?6 month(s) ? ?The format for your next appointment:   ?In Person ? ?Provider:   ?Dorris Carnes, MD  ? ? ?Other Instructions ?Thank you for choosing [facility]! ? ? ? ?

## 2021-06-17 DIAGNOSIS — M546 Pain in thoracic spine: Secondary | ICD-10-CM | POA: Diagnosis not present

## 2021-06-17 DIAGNOSIS — M5442 Lumbago with sciatica, left side: Secondary | ICD-10-CM | POA: Diagnosis not present

## 2021-06-17 DIAGNOSIS — M9903 Segmental and somatic dysfunction of lumbar region: Secondary | ICD-10-CM | POA: Diagnosis not present

## 2021-06-17 DIAGNOSIS — M9902 Segmental and somatic dysfunction of thoracic region: Secondary | ICD-10-CM | POA: Diagnosis not present

## 2021-06-17 DIAGNOSIS — M9905 Segmental and somatic dysfunction of pelvic region: Secondary | ICD-10-CM | POA: Diagnosis not present

## 2021-07-10 DIAGNOSIS — M25512 Pain in left shoulder: Secondary | ICD-10-CM | POA: Diagnosis not present

## 2021-07-14 ENCOUNTER — Ambulatory Visit: Payer: Medicare Other | Admitting: Nutrition

## 2021-07-27 NOTE — Progress Notes (Signed)
?[facility] Note ? ?08/05/2021 ? ?  ? ?CHIEF COMPLAINT ?Patient presents for Retina Follow Up ? ? ?HISTORY OF PRESENT ILLNESS: ?Malik Bowen is a 71 y.o. male who presents to the clinic today for:  ? ?HPI   ? ? Retina Follow Up   ?Patient presents with  Other.  In both eyes.  This started 1 year ago.  I, the attending physician,  performed the HPI with the patient and updated documentation appropriately. ? ?  ?  ? ? Comments   ?Patient here for 1 year follow up for DM exam. Patient states vision doing fine. No eye pain. 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  ? ?  ?  ?Last edited by Bernarda Caffey, MD on 08/06/2021  9:54 PM.  ?  ?Pt states his last A1c was checked 3 months ago and was 6.2, he takes metformin once a day ? ?Referring physician: ?Tannen Munch, PA-C ?[address] ?[place],  [place] [address] ? ?HISTORICAL INFORMATION:  ? ?Selected notes from the [facility] ?Self referred for DM exam  ? ?CURRENT MEDICATIONS: ?No current outpatient medications on file. (Ophthalmic Drugs)  ? ?No current facility-administered medications for this visit. (Ophthalmic Drugs)  ? ?Current Outpatient Medications (Other)  ?Medication Sig  ? atorvastatin (LIPITOR) 40 MG tablet Take 40 mg by mouth daily.  ? celecoxib (CELEBREX) 200 MG capsule Take 200 mg by mouth as needed.   ? hydrochlorothiazide (HYDRODIURIL) 25 MG tablet Take 25 mg by mouth daily.  ? metFORMIN (GLUCOPHAGE) 500 MG tablet Take 500 mg by mouth daily with breakfast.   ? olmesartan (BENICAR) 20 MG tablet Take 1 tablet (20 mg total) by mouth daily.  ? silodosin (RAPAFLO) 8 MG CAPS capsule Take 1 capsule (8 mg total) by mouth daily with breakfast. (Patient not taking: Reported on 10/13/2020)  ? ?No current facility-administered medications for this visit. (Other)  ? ?REVIEW OF SYSTEMS: ?ROS   ?Positive for: Musculoskeletal, Endocrine, Eyes ?Negative for: Constitutional, Gastrointestinal, Neurological, Skin,  Genitourinary, HENT, Cardiovascular, Respiratory, Psychiatric, Allergic/Imm, Heme/Lymph ?Last edited by Theodore Demark, COA on 08/05/2021  8:22 AM.  ?  ? ?ALLERGIES ?Allergies  ?Allergen Reactions  ? Other   ?  Walnuts and strawberrys cause mouth sores  ? ?PAST MEDICAL HISTORY ?Past Medical History:  ?Diagnosis Date  ? Diabetes mellitus without complication ([place])   ? Hyperlipidemia   ? Hypertension   ? ?Past Surgical History:  ?Procedure Laterality Date  ? EYE SURGERY    ? LASIK    ? ?FAMILY HISTORY ?Family History  ?Problem Relation Age of Onset  ? Heart failure Father   ? Heart failure Brother   ? Prostate cancer Brother   ? Kidney failure Brother   ? ?SOCIAL HISTORY ?Social History  ? ?Tobacco Use  ? Smoking status: Never  ? Smokeless tobacco: Never  ?Vaping Use  ? Vaping Use: Never used  ?Substance Use Topics  ? Alcohol use: No  ? Drug use: No  ?  ? ?  ?OPHTHALMIC EXAM: ? ?Base Eye Exam   ? ? Visual Acuity (Snellen - Linear)   ? ?   Right Left  ? Dist cc 20/20 -2 20/20 -2  ? ? Correction: Glasses  ? ?  ?  ? ? Tonometry (Tonopen, 8:20 AM)   ? ?   Right Left  ? Pressure 06 05  ? ?  ?  ? ? Pupils   ? ?  Dark Light Shape React APD  ? Right 3 2 Round Brisk None  ? Left 3 2 Round Brisk None  ? ?  ?  ? ? Visual Fields (Counting fingers)   ? ?   Left Right  ?  Full Full  ? ?  ?  ? ? Extraocular Movement   ? ?   Right Left  ?  Full, Ortho Full, Ortho  ? ?  ?  ? ? Neuro/Psych   ? ? Oriented x3: Yes  ? Mood/Affect: Normal  ? ?  ?  ? ? Dilation   ? ? Both eyes: 1.0% Mydriacyl, 2.5% Phenylephrine @ 8:20 AM  ? ?  ?  ? ?  ? ?Slit Lamp and Fundus Exam   ? ? Slit Lamp Exam   ? ?   Right Left  ? Lids/Lashes Dermatochalasis - upper lid, Meibomian gland dysfunction Dermatochalasis - upper lid, Meibomian gland dysfunction  ? Conjunctiva/Sclera White and quiet White and quiet  ? Cornea Arcus, trace Punctate epithelial erosions inferiorly, barely visible lasik flap, 1-2+ Guttata Arcus, trace Punctate epithelial erosions, barely  visible lasik flap, 2+ Guttata  ? Anterior Chamber Deep and quiet Deep and quiet  ? Iris Round and dilated, No NVI Round and dilated, No NVI  ? Lens 2+ Nuclear sclerosis, 2-3+ Cortical cataract 2-3+ Nuclear sclerosis, 2-3+ Cortical cataract  ? Anterior Vitreous Vitreous syneresis, Posterior vitreous detachment, vitreous condesations Vitreous syneresis, Posterior vitreous detachment, vitreous condensations  ? ?  ?  ? ? Fundus Exam   ? ?   Right Left  ? Disc pink and sharp, Tilted disc, mild temporal Peripapillary atrophy pink and sharp, Tilted disc, Temporal Peripapillary atrophy and pigment  ? C/D Ratio 0.4 0.4  ? Macula Flat, blunted foveal reflex, mild Retinal pigment epithelial mottling, No heme or edema Flat, blunted foveal reflex, mild Retinal pigment epithelial mottling, No heme or edema  ? Vessels attenuated, mild tortuosity attenuated, mild tortuosity  ? Periphery Attached, mild paving stone inferiorly, mild pigmented Chorioretinal scar at 0730, no heme Attached, mild peripheral CR atrophy temporally, no heme  ? ?  ?  ? ?  ? ?Refraction   ? ? Wearing Rx   ? ?   Sphere Cylinder Axis Add  ? Right [place] +0.25 025 +2.50  ? Left -0.75 +1.00 158 +2.50  ? ? Type: PAL  ? ?  ?  ? ?  ? ? ?IMAGING AND PROCEDURES  ?Imaging and Procedures for '@TODAY'$ @ ? ?OCT, Retina - OU - Both Eyes   ? ?   ?Right Eye ?Quality was good. Central Foveal Thickness: 295. Progression has improved. Findings include no IRF, no SRF, epiretinal membrane, normal foveal contour (Interval improvement in ERM and pucker).  ? ?Left Eye ?Quality was good. Central Foveal Thickness: 284. Progression has been stable. Findings include normal foveal contour, no IRF, no SRF (Trace ERM superior macula).  ? ?Notes ?*Images captured and stored on drive ? ?Diagnosis / Impression:  ?No DME OU ?OD: Interval improvement in ERM and pucker ? ?Clinical management:  ?See below ? ?Abbreviations: NFP - Normal foveal profile. CME - cystoid macular edema. PED - pigment  epithelial detachment. IRF - intraretinal fluid. SRF - subretinal fluid. EZ - ellipsoid zone. ERM - epiretinal membrane. ORA - outer retinal atrophy. ORT - outer retinal tubulation. SRHM - subretinal hyper-reflective material ? ? ?  ?  ?  ? ?  ?ASSESSMENT/PLAN: ? ?  ICD-10-CM   ?1. Diabetes mellitus type 2 without  retinopathy ([place])  E11.9 OCT, Retina - OU - Both Eyes  ?  ?2. Posterior vitreous detachment of both eyes  H43.813   ?  ?3. Epiretinal membrane (ERM) of right eye  H35.371   ?  ?4. Hx of LASIK  Z98.890   ?  ?5. Combined forms of age-related cataract of both eyes  H25.813   ?  ? ?1. Diabetes mellitus, type 2 without retinopathy ? - pt self reports A1c of 6.2% checked 3 months ago ? - The incidence, risk factors for progression, natural history and treatment options for diabetic retinopathy  were discussed with patient.   ? - The need for close monitoring of blood glucose, blood pressure, and serum lipids, avoiding cigarette or any type of tobacco, and the need for long term follow up was also discussed with patient. ? - f/u in 1 year, sooner prn ? ?2. PVD / vitreous syneresis ? - fairly prominent vitreous condensations ? - stable  ? - Discussed findings and prognosis ? - No RT or RD on 360 peripheral exam ? - Reviewed s/s of RT/RD ? - Strict return precautions for any such RT/RD signs/symptoms ? ?3,4. Epiretinal membrane, OD  ?- OCT shows interval improvement in ERM and pucker ?- BCVA 20/20 ?- asymptomatic, no metamorphopsia ?- no indication for surgery at this time ?- monitor for now ?- f/u 1 year -- DFE/OCT ? ?5. Hx of Lasik OU-  ? - doing well ? - monitor ? ?6. Combined forma age-related cataracts OU-  ? - The symptoms of cataract, surgical options, and treatments and risks were discussed with patient. ? - discussed diagnosis and progression ? - not yet visually significant ? - monitor for now ? ?Ophthalmic Meds Ordered this visit:  ?No orders of the defined types were placed in this encounter. ?   ? ?Return in about 1 year (around 08/06/2022) for f/u DM exam, DFE, OCT. ? ?There are no Patient Instructions on file for this visit. ? ?This document serves as a record of services personally performed by Gardiner Sleeper, MD,

## 2021-08-05 ENCOUNTER — Encounter (INDEPENDENT_AMBULATORY_CARE_PROVIDER_SITE_OTHER): Payer: Self-pay | Admitting: Ophthalmology

## 2021-08-05 ENCOUNTER — Ambulatory Visit (INDEPENDENT_AMBULATORY_CARE_PROVIDER_SITE_OTHER): Payer: Medicare Other | Admitting: Ophthalmology

## 2021-08-05 DIAGNOSIS — E119 Type 2 diabetes mellitus without complications: Secondary | ICD-10-CM

## 2021-08-05 DIAGNOSIS — H43813 Vitreous degeneration, bilateral: Secondary | ICD-10-CM

## 2021-08-05 DIAGNOSIS — H25813 Combined forms of age-related cataract, bilateral: Secondary | ICD-10-CM

## 2021-08-05 DIAGNOSIS — H35371 Puckering of macula, right eye: Secondary | ICD-10-CM | POA: Diagnosis not present

## 2021-08-05 DIAGNOSIS — Z9889 Other specified postprocedural states: Secondary | ICD-10-CM | POA: Diagnosis not present

## 2021-08-06 ENCOUNTER — Encounter (INDEPENDENT_AMBULATORY_CARE_PROVIDER_SITE_OTHER): Payer: Self-pay | Admitting: Ophthalmology

## 2021-08-17 DIAGNOSIS — M461 Sacroiliitis, not elsewhere classified: Secondary | ICD-10-CM | POA: Diagnosis not present

## 2021-09-01 ENCOUNTER — Other Ambulatory Visit: Payer: Self-pay | Admitting: Podiatry

## 2021-09-01 MED ORDER — METHYLPREDNISOLONE 4 MG PO TBPK: ORAL_TABLET | ORAL | 0 refills | Status: DC

## 2021-09-01 NOTE — Progress Notes (Signed)
PRN acute pain flare up

## 2021-09-04 DIAGNOSIS — J329 Chronic sinusitis, unspecified: Secondary | ICD-10-CM | POA: Diagnosis not present

## 2021-09-04 DIAGNOSIS — E119 Type 2 diabetes mellitus without complications: Secondary | ICD-10-CM | POA: Diagnosis not present

## 2021-09-04 DIAGNOSIS — J40 Bronchitis, not specified as acute or chronic: Secondary | ICD-10-CM | POA: Diagnosis not present

## 2021-09-04 DIAGNOSIS — G4733 Obstructive sleep apnea (adult) (pediatric): Secondary | ICD-10-CM | POA: Diagnosis not present

## 2021-09-04 DIAGNOSIS — I10 Essential (primary) hypertension: Secondary | ICD-10-CM | POA: Diagnosis not present

## 2021-09-09 ENCOUNTER — Ambulatory Visit: Payer: Medicare Other | Admitting: Podiatry

## 2021-09-14 DIAGNOSIS — E782 Mixed hyperlipidemia: Secondary | ICD-10-CM | POA: Diagnosis not present

## 2021-09-14 DIAGNOSIS — E119 Type 2 diabetes mellitus without complications: Secondary | ICD-10-CM | POA: Diagnosis not present

## 2021-09-14 DIAGNOSIS — D690 Allergic purpura: Secondary | ICD-10-CM | POA: Diagnosis not present

## 2021-09-14 DIAGNOSIS — Z0001 Encounter for general adult medical examination with abnormal findings: Secondary | ICD-10-CM | POA: Diagnosis not present

## 2021-09-14 DIAGNOSIS — I10 Essential (primary) hypertension: Secondary | ICD-10-CM | POA: Diagnosis not present

## 2021-09-14 DIAGNOSIS — M1991 Primary osteoarthritis, unspecified site: Secondary | ICD-10-CM | POA: Diagnosis not present

## 2021-09-14 DIAGNOSIS — E7849 Other hyperlipidemia: Secondary | ICD-10-CM | POA: Diagnosis not present

## 2021-09-14 DIAGNOSIS — G4733 Obstructive sleep apnea (adult) (pediatric): Secondary | ICD-10-CM | POA: Diagnosis not present

## 2021-09-18 ENCOUNTER — Other Ambulatory Visit: Payer: Self-pay | Admitting: Family Medicine

## 2021-09-18 ENCOUNTER — Other Ambulatory Visit (HOSPITAL_COMMUNITY): Payer: Self-pay | Admitting: Family Medicine

## 2021-09-18 DIAGNOSIS — M25512 Pain in left shoulder: Secondary | ICD-10-CM | POA: Diagnosis not present

## 2021-09-25 DIAGNOSIS — M25512 Pain in left shoulder: Secondary | ICD-10-CM | POA: Diagnosis not present

## 2021-09-26 HISTORY — PX: SHOULDER SURGERY: SHX246

## 2021-10-02 DIAGNOSIS — M25512 Pain in left shoulder: Secondary | ICD-10-CM | POA: Diagnosis not present

## 2021-10-05 ENCOUNTER — Encounter (HOSPITAL_COMMUNITY): Payer: Self-pay

## 2021-10-05 ENCOUNTER — Other Ambulatory Visit (HOSPITAL_COMMUNITY): Payer: Medicare Other

## 2021-10-05 DIAGNOSIS — M25512 Pain in left shoulder: Secondary | ICD-10-CM | POA: Diagnosis not present

## 2021-10-15 DIAGNOSIS — G8918 Other acute postprocedural pain: Secondary | ICD-10-CM | POA: Diagnosis not present

## 2021-10-15 DIAGNOSIS — M19012 Primary osteoarthritis, left shoulder: Secondary | ICD-10-CM | POA: Diagnosis not present

## 2021-10-15 DIAGNOSIS — M7522 Bicipital tendinitis, left shoulder: Secondary | ICD-10-CM | POA: Diagnosis not present

## 2021-10-15 DIAGNOSIS — S46012A Strain of muscle(s) and tendon(s) of the rotator cuff of left shoulder, initial encounter: Secondary | ICD-10-CM | POA: Diagnosis not present

## 2021-10-15 DIAGNOSIS — M24112 Other articular cartilage disorders, left shoulder: Secondary | ICD-10-CM | POA: Diagnosis not present

## 2021-10-15 DIAGNOSIS — S46812A Strain of other muscles, fascia and tendons at shoulder and upper arm level, left arm, initial encounter: Secondary | ICD-10-CM | POA: Diagnosis not present

## 2021-10-15 DIAGNOSIS — M7542 Impingement syndrome of left shoulder: Secondary | ICD-10-CM | POA: Diagnosis not present

## 2021-10-26 DIAGNOSIS — M19012 Primary osteoarthritis, left shoulder: Secondary | ICD-10-CM | POA: Diagnosis not present

## 2021-11-03 ENCOUNTER — Encounter (HOSPITAL_COMMUNITY): Payer: Self-pay | Admitting: Occupational Therapy

## 2021-11-03 ENCOUNTER — Ambulatory Visit (HOSPITAL_COMMUNITY): Payer: Medicare Other | Attending: Orthopedic Surgery | Admitting: Occupational Therapy

## 2021-11-03 DIAGNOSIS — R29898 Other symptoms and signs involving the musculoskeletal system: Secondary | ICD-10-CM | POA: Insufficient documentation

## 2021-11-03 DIAGNOSIS — M25512 Pain in left shoulder: Secondary | ICD-10-CM | POA: Insufficient documentation

## 2021-11-03 DIAGNOSIS — M25612 Stiffness of left shoulder, not elsewhere classified: Secondary | ICD-10-CM | POA: Insufficient documentation

## 2021-11-03 NOTE — Therapy (Signed)
OUTPATIENT OCCUPATIONAL THERAPY ORTHO EVALUATION  Patient Name: Angad Nabers MRN: [id number] DOB:1951-01-18, 71 y.o., male Today's Date: 11/03/2021  PCP: Collene Mares, PA-C REFERRING PROVIDER: Edmonia Lynch, MD   OT End of Session - 11/03/21 1051     Visit Number 1    Number of Visits 16    Date for OT Re-Evaluation 01/02/22   mini-reassessment 12/03/21   Authorization Type [employer] Medicare, $20 copay    Authorization Time Period no visit limit    Progress Note Due on Visit 10    OT Start Time [phone]    OT Stop Time 1025    OT Time Calculation (min) 34 min    Activity Tolerance Patient tolerated treatment well    Behavior During Therapy [facility] for tasks assessed/performed             Past Medical History:  Diagnosis Date   Diabetes mellitus without complication ([place])    Hyperlipidemia    Hypertension    Past Surgical History:  Procedure Laterality Date   EYE SURGERY     LASIK     Patient Active Problem List   Diagnosis Date Noted   Weak urinary stream 04/10/2020   Benign prostatic hyperplasia with urinary obstruction 04/10/2020   Osteoarthritis of carpometacarpal ([place]) joint of thumb 12/20/2018   Diabetes mellitus ([place]) 12/20/2018   Hypertensive disorder 12/20/2018   Hypercholesterolemia 12/20/2018   Carpal tunnel syndrome of left wrist 12/20/2018   Sacroiliac joint pain 05/17/2018   Arthritis of wrist 07/19/2017    ONSET DATE: 10/15/21  REFERRING DIAG: S/P L SH Subacromial Decompression, DCE, biceps tenodesis, RCR  THERAPY DIAG:  Acute pain of left shoulder  Stiffness of left shoulder, not elsewhere classified  Other symptoms and signs involving the musculoskeletal system  Rationale for Evaluation and Treatment Rehabilitation  SUBJECTIVE:   SUBJECTIVE STATEMENT: S: I haven't had to take any pain medication.  Pt accompanied by: self  PERTINENT HISTORY: Pt is a 71 y/o male s/p left RCR, bicep tenodesis, and SAD, DCE on 10/15/21. Pt injured his LUE earlier this  year while mountain biking. Pt presents with sling on, reports he wears most of the time but sometimes takes off and lets his arm hang by his side. Has attempted to reach for a few items with discomfort.   PRECAUTIONS: Shoulder  WEIGHT BEARING RESTRICTIONS Yes NWB  PAIN:  Are you having pain? No and only when I do things I shouldn't   FALLS: Has patient fallen in last 6 months? Yes. Number of falls 1   PLOF: Independent  PATIENT GOALS To improve strength and use of LUE as dominant.   OBJECTIVE:   HAND DOMINANCE: Left  ADLs: Overall ADLs: Pt is having difficulty with dressing, reaching up and behind back, is unable to lift and carry items. Pt is sleeping in a recliner at this time. Pt is a Teaching laboratory technician, does a lot of yardwork.    FUNCTIONAL OUTCOME MEASURES: FOTO: 43/100   UPPER EXTREMITY ROM      Assessed supine, er/IR adducted  Passive ROM Left eval  Shoulder flexion 112  Shoulder abduction 134  Shoulder internal rotation 90  Shoulder external rotation 34  (Blank rows = not tested)   UPPER EXTREMITY MMT:      Not assessed due to protocol  MMT Left eval  Shoulder flexion   Shoulder abduction   Shoulder internal rotation   Shoulder external rotation   (Blank rows = not tested)   COGNITION: Overall cognitive status: Within  functional limits for tasks assessed    PATIENT EDUCATION: Education details: Table slides Person educated: Patient Education method: Explanation, Demonstration, and Handouts Education comprehension: verbalized understanding and returned demonstration   HOME EXERCISE PROGRAM: Eval: Table slides  GOALS: Goals reviewed with patient? Yes  SHORT TERM GOALS: Target date: 12/01/2021    Pt will be provided with and educated on HEP to improve mobility required for use of LUE during ADL completion.   Goal status: INITIAL  2.  Pt will increase LUE P/ROM to [facility] to improve ability to perform dressing and bathing tasks with minimal  compensatory strategies.   Goal status: INITIAL  3.  Pt will increase LUE strength to 3+/5 to improve ability to reach items located at waist to chest height during ADLs such as meal preparation and eating.   Goal status: INITIAL   LONG TERM GOALS: Target date: 12/29/2021    Pt will decrease pain in LUE to 3/10 or less to improve ability to sleep in the bed for 3+ consecutive hours without waking due to pain.   Goal status: INITIAL  2.  Pt will decrease fascial restrictions in LUE to trace amounts to improve mobility required for functional reaching tasks.   Goal status: INITIAL  3.  Pt will increase LUE A/ROM to [facility] to improve ability to reach for items overhead and behind back during dressing, bathing, and functional reaching tasks.   Goal status: INITIAL  4.  Pt will increase LUE strength to 4+/5 or greater to improve ability to return to mountain biking and yardwork tasks.    Goal status: INITIAL  5.  Pt will return to highest level of functioning using LUE as dominant during ADL and leisure tasks.  Baseline:  Goal status: INITIAL  ASSESSMENT:  CLINICAL IMPRESSION: Patient is a 71 y.o. male who was seen today for occupational therapy evaluation s/p left RCR, bicep tenodesis, SAD, and DCE on 10/15/21, presenting with decreased ROM and strength, increased pain and fascial restrictions limiting ADL completion. Pt reports he has not had to take any pain medication, wears sling sometimes, completes HEP from MD. Educated on therapy process and expected progress, HEP to begin.   PERFORMANCE DEFICITS in functional skills including ADLs, IADLs, ROM, strength, pain, fascial restrictions, muscle spasms, and UE functional use  IMPAIRMENTS are limiting patient from ADLs, IADLs, rest and sleep, and leisure.   COMORBIDITIES has no other co-morbidities that affects occupational performance. Patient will benefit from skilled OT to address above impairments and improve overall  function.  MODIFICATION OR ASSISTANCE TO COMPLETE EVALUATION: No modification of tasks or assist necessary to complete an evaluation.  OT OCCUPATIONAL PROFILE AND HISTORY: Problem focused assessment: Including review of records relating to presenting problem.  CLINICAL DECISION MAKING: LOW - limited treatment options, no task modification necessary  REHAB POTENTIAL: Excellent  EVALUATION COMPLEXITY: Low      PLAN: OT FREQUENCY: 2x/week  OT DURATION: 8 weeks  PLANNED INTERVENTIONS: self care/ADL training, therapeutic exercise, therapeutic activity, manual therapy, scar mobilization, passive range of motion, electrical stimulation, ultrasound, moist heat, cryotherapy, and patient/family education   CONSULTED AND AGREED WITH PLAN OF CARE: Patient  PLAN FOR NEXT SESSION: Initiate myofascial release, passive stretching. Follow protocol. Follow up on [place], OTR/L  [phone] 11/03/2021, 10:52 AM

## 2021-11-03 NOTE — Patient Instructions (Signed)
1) SHOULDER: Flexion On Table   Place hands on towel placed on table, elbows straight. Lean forward with you upper body, pushing towel away from body.  _10-15__ reps per set, __3_ sets per day  2) Abduction (Passive)   With arm out to side, resting on towel placed on table with palm DOWN, keeping trunk away from table, lean to the side while pushing towel away from body.  Repeat _10-15___ times. Do __3__ sessions per day.  Copyright  VHI. All rights reserved.     3) Internal Rotation (Assistive)   Seated with elbow bent at right angle and held against side, slide arm on table surface in an inward arc keeping elbow anchored in place. Repeat __10-15__ times. Do __3__ sessions per day. Activity: Use this motion to brush crumbs off the table.  Copyright  VHI. All rights reserved.

## 2021-11-06 DIAGNOSIS — M9905 Segmental and somatic dysfunction of pelvic region: Secondary | ICD-10-CM | POA: Diagnosis not present

## 2021-11-06 DIAGNOSIS — M546 Pain in thoracic spine: Secondary | ICD-10-CM | POA: Diagnosis not present

## 2021-11-06 DIAGNOSIS — M9903 Segmental and somatic dysfunction of lumbar region: Secondary | ICD-10-CM | POA: Diagnosis not present

## 2021-11-06 DIAGNOSIS — M9902 Segmental and somatic dysfunction of thoracic region: Secondary | ICD-10-CM | POA: Diagnosis not present

## 2021-11-06 DIAGNOSIS — M5442 Lumbago with sciatica, left side: Secondary | ICD-10-CM | POA: Diagnosis not present

## 2021-11-09 ENCOUNTER — Ambulatory Visit (HOSPITAL_COMMUNITY): Payer: Medicare Other

## 2021-11-09 ENCOUNTER — Encounter (HOSPITAL_COMMUNITY): Payer: Self-pay

## 2021-11-09 DIAGNOSIS — M25612 Stiffness of left shoulder, not elsewhere classified: Secondary | ICD-10-CM | POA: Diagnosis not present

## 2021-11-09 DIAGNOSIS — M25512 Pain in left shoulder: Secondary | ICD-10-CM | POA: Diagnosis not present

## 2021-11-09 DIAGNOSIS — R29898 Other symptoms and signs involving the musculoskeletal system: Secondary | ICD-10-CM

## 2021-11-09 NOTE — Therapy (Signed)
OUTPATIENT OCCUPATIONAL THERAPY TREATMENT NOTE   Patient Name: Malik Bowen MRN: [id number] DOB:02-Mar-1951, 71 y.o., male Today's Date: 11/09/2021  PCP: Collene Mares, PA-C REFERRING PROVIDER: Edmonia Lynch, MD   OT End of Session - 11/09/21 1158     Visit Number 2    Number of Visits 16    Date for OT Re-Evaluation 01/02/22   mini-reassessment 12/03/21   Authorization Type [employer] Medicare, $20 copay    Authorization Time Period no visit limit    Progress Note Due on Visit 10    OT Start Time 1118    OT Stop Time 1157    OT Time Calculation (min) 39 min    Activity Tolerance Patient tolerated treatment well    Behavior During Therapy WFL for tasks assessed/performed             Past Medical History:  Diagnosis Date   Diabetes mellitus without complication ([place])    Hyperlipidemia    Hypertension    Past Surgical History:  Procedure Laterality Date   EYE SURGERY     LASIK     Patient Active Problem List   Diagnosis Date Noted   Weak urinary stream 04/10/2020   Benign prostatic hyperplasia with urinary obstruction 04/10/2020   Osteoarthritis of carpometacarpal ([place]) joint of thumb 12/20/2018   Diabetes mellitus ([place]) 12/20/2018   Hypertensive disorder 12/20/2018   Hypercholesterolemia 12/20/2018   Carpal tunnel syndrome of left wrist 12/20/2018   Sacroiliac joint pain 05/17/2018   Arthritis of wrist 07/19/2017    ONSET DATE: 10/15/21  REFERRING DIAG: S/P L SH Subacromial Decompression, DCE, biceps tenodesis, RCR  THERAPY DIAG:  Acute pain of left shoulder  Stiffness of left shoulder, not elsewhere classified  Other symptoms and signs involving the musculoskeletal system  Rationale for Evaluation and Treatment Rehabilitation  PERTINENT HISTORY: Pt is a 71 y/o male s/p left RCR, bicep tenodesis, and SAD, DCE on 10/15/21. Pt injured his LUE earlier this year while mountain biking. Pt presents with sling on, reports he wears most of the time but sometimes takes off  and lets his arm hang by his side. Has attempted to reach for a few items with discomfort.   PRECAUTIONS: Yes NWB  SUBJECTIVE: S: "It has been a little sore but I think that's because I have been doing my exercises."  PAIN:  Are you having pain? No  2/10 pain with movement or exercise     OBJECTIVE:   HAND DOMINANCE: Left   ADLs: Overall ADLs: Pt is having difficulty with dressing, reaching up and behind back, is unable to lift and carry items. Pt is sleeping in a recliner at this time. Pt is a Teaching laboratory technician, does a lot of yardwork.      FUNCTIONAL OUTCOME MEASURES: FOTO: 43/100     UPPER EXTREMITY ROM                  Assessed supine, er/IR adducted   Passive ROM Left eval  Shoulder flexion 112  Shoulder abduction 134  Shoulder internal rotation 90  Shoulder external rotation 34  (Blank rows = not tested)     UPPER EXTREMITY MMT:                  Not assessed due to protocol   MMT Left eval  Shoulder flexion    Shoulder abduction    Shoulder internal rotation    Shoulder external rotation    (Blank rows = not tested)  GOALS: Goals reviewed with patient? Yes   SHORT TERM GOALS: Target date: 12/01/2021     Pt will be provided with and educated on HEP to improve mobility required for use of LUE during ADL completion.    Goal status: IN PROGRESS   2.  Pt will increase LUE P/ROM to [facility] to improve ability to perform dressing and bathing tasks with minimal compensatory strategies.    Goal status: IN PROGRESS   3.  Pt will increase LUE strength to 3+/5 to improve ability to reach items located at waist to chest height during ADLs such as meal preparation and eating.    Goal status: IN PROGRESS     LONG TERM GOALS: Target date: 12/29/2021     Pt will decrease pain in LUE to 3/10 or less to improve ability to sleep in the bed for 3+ consecutive hours without waking due to pain.    Goal status: IN PROGRESS   2.  Pt will decrease fascial  restrictions in LUE to trace amounts to improve mobility required for functional reaching tasks.    Goal status: IN PROGRESS   3.  Pt will increase LUE A/ROM to [facility] to improve ability to reach for items overhead and behind back during dressing, bathing, and functional reaching tasks.    Goal status: IN PROGRESS   4.  Pt will increase LUE strength to 4+/5 or greater to improve ability to return to mountain biking and yardwork tasks.    Goal status: IN PROGRESS   5.  Pt will return to highest level of functioning using LUE as dominant during ADL and leisure tasks.  Baseline:  Goal status: IN PROGRESS    TODAY'S TREATMENT:  -Manual Therapy: Myofascial release, soft tissue mobilization, and trigger point manual therapy completed to left upper quadrant to decrease fascial restrictions and pain and allow for increased P/ROM -Scapular ROM: 1x10 retraction, elevation/depression.  -P/ROM: Supine, 1x10 flexion, abduction, and IR/er -Table slides: 1x10 flexion, abduction, IR/er   PATIENT EDUCATION: Education details: Shoulder precautions  Person educated: Patient Education method: Explanation Education comprehension: verbalized understanding   HOME EXERCISE PROGRAM Eval: Table slides  ASSESSMENT:   CLINICAL IMPRESSION: A: Pt presenting with no pain at start of session. Manual  therapy complete to address some muscle restrictions throughout pectoralis and upper trapezius regions with pt reporting decreased tightness with transition to passive stretching. Pt able to tolerate passive stretch with about 65% flexion, 50% abduction, slight discomfort at end range with pt reporting "a really good stretch. It gets looser every time." Reviewed HEP with therapist cuing to slow down movement. Minimal to moderate tactile cuing for scapular ROM exercises to ensure proper muscle recruitment. Therapist discouraged pt from moving his lawn with tractor and instructed pt to rest UE on pillow when watching  TV.   PLAN:  OT FREQUENCY: 2x/week   OT DURATION: 8 weeks   PLANNED INTERVENTIONS: self care/ADL training, therapeutic exercise, therapeutic activity, manual therapy, scar mobilization, passive range of motion, electrical stimulation, ultrasound, moist heat, cryotherapy, and patient/family education     CONSULTED AND AGREED WITH PLAN OF CARE: Patient   PLAN FOR NEXT SESSION: Initiate myofascial release, passive stretching. Follow protocol. Follow up on HEP. Add scapular ROM to [employer], [employer], OTR/L [phone]  11/09/2021, 1:43 PM

## 2021-11-11 ENCOUNTER — Ambulatory Visit (HOSPITAL_COMMUNITY): Payer: Medicare Other | Admitting: Occupational Therapy

## 2021-11-11 ENCOUNTER — Encounter (HOSPITAL_COMMUNITY): Payer: Self-pay | Admitting: Occupational Therapy

## 2021-11-11 DIAGNOSIS — M25512 Pain in left shoulder: Secondary | ICD-10-CM

## 2021-11-11 DIAGNOSIS — R29898 Other symptoms and signs involving the musculoskeletal system: Secondary | ICD-10-CM | POA: Diagnosis not present

## 2021-11-11 DIAGNOSIS — M25612 Stiffness of left shoulder, not elsewhere classified: Secondary | ICD-10-CM

## 2021-11-11 NOTE — Therapy (Signed)
OUTPATIENT OCCUPATIONAL THERAPY TREATMENT NOTE   Patient Name: Malik Bowen MRN: [id number] DOB:1950/10/31, 71 y.o., male Today's Date: 11/11/2021  PCP: Collene Mares, PA-C REFERRING PROVIDER: Edmonia Lynch, MD   OT End of Session - 11/11/21 1200     Visit Number 3    Number of Visits 16    Date for OT Re-Evaluation 01/02/22   mini-reassessment 12/03/21   Authorization Type [employer] Medicare, $20 copay    Authorization Time Period no visit limit    Progress Note Due on Visit 10    OT Start Time 1120    OT Stop Time 1147    OT Time Calculation (min) 27 min    Activity Tolerance Patient tolerated treatment well    Behavior During Therapy WFL for tasks assessed/performed              Past Medical History:  Diagnosis Date   Diabetes mellitus without complication ([place])    Hyperlipidemia    Hypertension    Past Surgical History:  Procedure Laterality Date   EYE SURGERY     LASIK     Patient Active Problem List   Diagnosis Date Noted   Weak urinary stream 04/10/2020   Benign prostatic hyperplasia with urinary obstruction 04/10/2020   Osteoarthritis of carpometacarpal ([place]) joint of thumb 12/20/2018   Diabetes mellitus ([place]) 12/20/2018   Hypertensive disorder 12/20/2018   Hypercholesterolemia 12/20/2018   Carpal tunnel syndrome of left wrist 12/20/2018   Sacroiliac joint pain 05/17/2018   Arthritis of wrist 07/19/2017    ONSET DATE: 10/15/21  REFERRING DIAG: S/P L SH Subacromial Decompression, DCE, biceps tenodesis, RCR  THERAPY DIAG:  Acute pain of left shoulder  Stiffness of left shoulder, not elsewhere classified  Other symptoms and signs involving the musculoskeletal system  Rationale for Evaluation and Treatment Rehabilitation  PERTINENT HISTORY: Pt is a 71 y/o male s/p left RCR, bicep tenodesis, and SAD, DCE on 10/15/21. Pt injured his LUE earlier this year while mountain biking. Pt presents with sling on, reports he wears most of the time but sometimes takes  off and lets his arm hang by his side. Has attempted to reach for a few items with discomfort.   PRECAUTIONS: NWB; shoulder-P/ROM for 6 weeks then AA/ROM and progress as tolerated  SUBJECTIVE: S: "It's a little sore in the mornings."   PAIN:  Are you having pain? No  2/10 pain with movement or exercise     OBJECTIVE:   HAND DOMINANCE: Left   ADLs: Overall ADLs: Pt is having difficulty with dressing, reaching up and behind back, is unable to lift and carry items. Pt is sleeping in a recliner at this time. Pt is a Teaching laboratory technician, does a lot of yardwork.      FUNCTIONAL OUTCOME MEASURES: FOTO: 43/100     UPPER EXTREMITY ROM                  Assessed supine, er/IR adducted   Passive ROM Left eval  Shoulder flexion 112  Shoulder abduction 134  Shoulder internal rotation 90  Shoulder external rotation 34  (Blank rows = not tested)     UPPER EXTREMITY MMT:                  Not assessed due to protocol   MMT Left eval  Shoulder flexion    Shoulder abduction    Shoulder internal rotation    Shoulder external rotation    (Blank rows = not tested)  GOALS: Goals reviewed with patient? Yes   SHORT TERM GOALS: Target date: 12/01/2021     Pt will be provided with and educated on HEP to improve mobility required for use of LUE during ADL completion.    Goal status: IN PROGRESS   2.  Pt will increase LUE P/ROM to [facility] to improve ability to perform dressing and bathing tasks with minimal compensatory strategies.    Goal status: IN PROGRESS   3.  Pt will increase LUE strength to 3+/5 to improve ability to reach items located at waist to chest height during ADLs such as meal preparation and eating.    Goal status: IN PROGRESS     LONG TERM GOALS: Target date: 12/29/2021     Pt will decrease pain in LUE to 3/10 or less to improve ability to sleep in the bed for 3+ consecutive hours without waking due to pain.    Goal status: IN PROGRESS   2.  Pt will decrease  fascial restrictions in LUE to trace amounts to improve mobility required for functional reaching tasks.    Goal status: IN PROGRESS   3.  Pt will increase LUE A/ROM to [facility] to improve ability to reach for items overhead and behind back during dressing, bathing, and functional reaching tasks.    Goal status: IN PROGRESS   4.  Pt will increase LUE strength to 4+/5 or greater to improve ability to return to mountain biking and yardwork tasks.    Goal status: IN PROGRESS   5.  Pt will return to highest level of functioning using LUE as dominant during ADL and leisure tasks.  Baseline:  Goal status: IN PROGRESS    TODAY'S TREATMENT:  11/11/21 -Manual Therapy: Myofascial release, soft tissue mobilization, and trigger point manual therapy completed to left upper quadrant to decrease fascial restrictions and pain and allow for increased P/ROM -P/ROM: Supine, 1x10 flexion, abduction, horizontal abduction, and IR/er -Scapular ROM: 1x10 retraction, elevation/depression, row -Therapy ball: flexion, abduction, 10 reps each -Pulleys: 1' flexion, 1' abduction  -Manual Therapy: Myofascial release, soft tissue mobilization, and trigger point manual therapy completed to left upper quadrant to decrease fascial restrictions and pain and allow for increased P/ROM -Scapular ROM: 1x10 retraction, elevation/depression.  -P/ROM: Supine, 1x10 flexion, abduction, and IR/er -Table slides: 1x10 flexion, abduction, IR/er   PATIENT EDUCATION: Education details: Shoulder precautions  Person educated: Patient Education method: Explanation Education comprehension: verbalized understanding   HOME EXERCISE PROGRAM Eval: Table slides  ASSESSMENT:   CLINICAL IMPRESSION: A: Continued with myofascial release, focusing on restrictions at bicep and anterior shoulder regions. Passive stretching completed, pt tolerating ROM to approximately 75-85% today, full abduction achieved. Added therapy ball stretches and  pulleys today, pt continues to demonstrate good ROM during tasks. Pt demonstrates proficiency with pendulums that he has been completing at home, reports feels good when his arm is hurting. Verbal cuing for form, technique, and speed during tasks. Did not utilize full session time as protocol limits pt to P/ROM, no AA/ROM or A/ROM allowed at this time.     PLAN:  OT FREQUENCY: 2x/week   OT DURATION: 8 weeks   PLANNED INTERVENTIONS: self care/ADL training, therapeutic exercise, therapeutic activity, manual therapy, scar mobilization, passive range of motion, electrical stimulation, ultrasound, moist heat, cryotherapy, and patient/family education     CONSULTED AND AGREED WITH PLAN OF CARE: Patient   PLAN FOR NEXT SESSION: continue myofascial release, passive stretching. Follow protocol. Add scapular ROM to HEP  Guadelupe Sabin, OTR/L  [phone] 11/11/2021, 12:01 PM

## 2021-11-17 ENCOUNTER — Encounter (HOSPITAL_COMMUNITY): Payer: Self-pay

## 2021-11-17 ENCOUNTER — Ambulatory Visit (HOSPITAL_COMMUNITY): Payer: Medicare Other

## 2021-11-17 DIAGNOSIS — R29898 Other symptoms and signs involving the musculoskeletal system: Secondary | ICD-10-CM

## 2021-11-17 DIAGNOSIS — M25612 Stiffness of left shoulder, not elsewhere classified: Secondary | ICD-10-CM

## 2021-11-17 DIAGNOSIS — M25512 Pain in left shoulder: Secondary | ICD-10-CM

## 2021-11-17 NOTE — Patient Instructions (Signed)
1) ROM: Abduction (Standing)   Bring arms straight out from sides and raise as high as possible without pain. Repeat ____ times per set. Do ____ sets per session. Do ____ sessions per day.  http://orth.exer.us/910   Copyright  VHI. All rights reserved.   2) Extension (Active) ROM: Extension (Standing)   5) Scapular Retraction (Standing)   With arms at sides, pinch shoulder blades together. Repeat ____ times per set. Do ____ sets per session. Do ____ sessions per day.  http://orth.exer.us/944   Copyright  VHI. All rights reserved.

## 2021-11-17 NOTE — Therapy (Signed)
OUTPATIENT OCCUPATIONAL THERAPY TREATMENT NOTE   Patient Name: Malik Bowen MRN: [id number] DOB:[birth year], 71 y.o., male Today's Date: 11/17/2021  PCP: Collene Mares, PA-C REFERRING PROVIDER: Edmonia Lynch, MD   OT End of Session - 11/17/21 1155     Visit Number 4    Number of Visits 16    Date for OT Re-Evaluation 01/02/22   mini-reassessment 12/03/21   Authorization Type [employer] Medicare, $20 copay    Authorization Time Period no visit limit    Progress Note Due on Visit 10    OT Start Time 1120    OT Stop Time 1158    OT Time Calculation (min) 38 min    Activity Tolerance Patient tolerated treatment well    Behavior During Therapy WFL for tasks assessed/performed               Past Medical History:  Diagnosis Date   Diabetes mellitus without complication ([place])    Hyperlipidemia    Hypertension    Past Surgical History:  Procedure Laterality Date   EYE SURGERY     LASIK     Patient Active Problem List   Diagnosis Date Noted   Weak urinary stream 04/10/2020   Benign prostatic hyperplasia with urinary obstruction 04/10/2020   Osteoarthritis of carpometacarpal ([place]) joint of thumb 12/20/2018   Diabetes mellitus ([place]) 12/20/2018   Hypertensive disorder 12/20/2018   Hypercholesterolemia 12/20/2018   Carpal tunnel syndrome of left wrist 12/20/2018   Sacroiliac joint pain 05/17/2018   Arthritis of wrist 07/19/2017    ONSET DATE: 10/15/21  REFERRING DIAG: S/P L SH Subacromial Decompression, DCE, biceps tenodesis, RCR  THERAPY DIAG:  Acute pain of left shoulder  Stiffness of left shoulder, not elsewhere classified  Other symptoms and signs involving the musculoskeletal system  Rationale for Evaluation and Treatment Rehabilitation  PERTINENT HISTORY: Pt is a 71 y/o male s/p left RCR, bicep tenodesis, and SAD, DCE on 10/15/21. Pt injured his LUE earlier this year while mountain biking. Pt presents with sling on, reports he wears most of the time but sometimes takes  off and lets his arm hang by his side. Has attempted to reach for a few items with discomfort.   PRECAUTIONS: NWB; shoulder-P/ROM for 6 weeks then AA/ROM and progress as tolerated  SUBJECTIVE: S: "It's feeling a little better, still stiff but the exercises help."   Pt has an apt 8/28  PAIN:  Are you having pain? No  1/10 pain with movement or exercise    OBJECTIVE:   HAND DOMINANCE: Left   ADLs: Overall ADLs: Pt is having difficulty with dressing, reaching up and behind back, is unable to lift and carry items. Pt is sleeping in a recliner at this time. Pt is a Teaching laboratory technician, does a lot of yardwork.      FUNCTIONAL OUTCOME MEASURES: FOTO: 43/100     UPPER EXTREMITY ROM                  Assessed supine, er/IR adducted   Passive ROM Left eval  Shoulder flexion 112  Shoulder abduction 134  Shoulder internal rotation 90  Shoulder external rotation 34  (Blank rows = not tested)     UPPER EXTREMITY MMT:                  Not assessed due to protocol   MMT Left eval  Shoulder flexion    Shoulder abduction    Shoulder internal rotation    Shoulder external rotation    (  Blank rows = not tested)      GOALS: Goals reviewed with patient? Yes   SHORT TERM GOALS: Target date: 12/01/2021     Pt will be provided with and educated on HEP to improve mobility required for use of LUE during ADL completion.    Goal status: IN PROGRESS   2.  Pt will increase LUE P/ROM to [facility] to improve ability to perform dressing and bathing tasks with minimal compensatory strategies.    Goal status: IN PROGRESS   3.  Pt will increase LUE strength to 3+/5 to improve ability to reach items located at waist to chest height during ADLs such as meal preparation and eating.    Goal status: IN PROGRESS     LONG TERM GOALS: Target date: 12/29/2021     Pt will decrease pain in LUE to 3/10 or less to improve ability to sleep in the bed for 3+ consecutive hours without waking due to pain.     Goal status: IN PROGRESS   2.  Pt will decrease fascial restrictions in LUE to trace amounts to improve mobility required for functional reaching tasks.    Goal status: IN PROGRESS   3.  Pt will increase LUE A/ROM to [facility] to improve ability to reach for items overhead and behind back during dressing, bathing, and functional reaching tasks.    Goal status: IN PROGRESS   4.  Pt will increase LUE strength to 4+/5 or greater to improve ability to return to mountain biking and yardwork tasks.    Goal status: IN PROGRESS   5.  Pt will return to highest level of functioning using LUE as dominant during ADL and leisure tasks.  Baseline:  Goal status: IN PROGRESS    TODAY'S TREATMENT:  11/17/21 -Manual Therapy: Myofascial release, soft tissue mobilization, and trigger point manual therapy completed to left upper quadrant to decrease fascial restrictions and pain and allow for increased P/ROM -P/ROM: Supine, 1x10 flexion, abduction, horizontal abduction, and IR/er  -Scapular ROM: 1x10 retraction, elevation/depression, row -Pulleys: 1' flexion, 1' abduction    11/11/21 -Manual Therapy: Myofascial release, soft tissue mobilization, and trigger point manual therapy completed to left upper quadrant to decrease fascial restrictions and pain and allow for increased P/ROM -P/ROM: Supine, 1x10 flexion, abduction, horizontal abduction, and IR/er -Scapular ROM: 1x10 retraction, elevation/depression, row -Therapy ball: flexion, abduction, 10 reps each -Pulleys: 1' flexion, 1' abduction  11/09/21 -Manual Therapy: Myofascial release, soft tissue mobilization, and trigger point manual therapy completed to left upper quadrant to decrease fascial restrictions and pain and allow for increased P/ROM -Scapular ROM: 1x10 retraction, elevation/depression.  -P/ROM: Supine, 1x10 flexion, abduction, and IR/er -Table slides: 1x10 flexion, abduction, IR/er   PATIENT EDUCATION: Education details: Scapular ROM   Person educated: Patient Education method: Explanation Education comprehension: verbalized understanding and returned demonstration   HOME EXERCISE PROGRAM Eval: Table slides 8/22: Scapular ROM  ASSESSMENT:   CLINICAL IMPRESSION: A: Pt reporting some increased "stiffness" at start of session, addressed with manual therapy throughout LUE, most emphasis on biceps and upper trapezius, pt stating, "that feels much better, a lot looser." P/ROM in all directions with full abduction and about 80% flexion. No pain, slight discomfort at end range that lessened with each repetition. Added scapular ROM to HEP with pt demonstrating with good form, moderate verbal cues for slow and controlled movement.    PLAN:  OT FREQUENCY: 2x/week   OT DURATION: 8 weeks   PLANNED INTERVENTIONS: self care/ADL training, therapeutic exercise, therapeutic activity,  manual therapy, scar mobilization, passive range of motion, electrical stimulation, ultrasound, moist heat, cryotherapy, and patient/family education     CONSULTED AND AGREED WITH PLAN OF CARE: Patient   PLAN FOR NEXT SESSION: continue myofascial release, passive stretching. Follow protocol. Follow up on [address] [place], OTR/L [phone]  11/17/2021, 12:10 PM

## 2021-11-19 ENCOUNTER — Encounter (HOSPITAL_COMMUNITY): Payer: Self-pay

## 2021-11-19 ENCOUNTER — Ambulatory Visit (HOSPITAL_COMMUNITY): Payer: Medicare Other

## 2021-11-19 DIAGNOSIS — M25612 Stiffness of left shoulder, not elsewhere classified: Secondary | ICD-10-CM | POA: Diagnosis not present

## 2021-11-19 DIAGNOSIS — R29898 Other symptoms and signs involving the musculoskeletal system: Secondary | ICD-10-CM

## 2021-11-19 DIAGNOSIS — M25512 Pain in left shoulder: Secondary | ICD-10-CM

## 2021-11-19 NOTE — Therapy (Signed)
OUTPATIENT OCCUPATIONAL THERAPY TREATMENT NOTE   Patient Name: Malik Bowen MRN: [id number] DOB:[birth year], 71 y.o., male Today's Date: 11/19/2021  PCP: Collene Mares, PA-C REFERRING PROVIDER: Edmonia Lynch, MD   OT End of Session - 11/19/21 1116     Visit Number 5    Number of Visits 16    Date for OT Re-Evaluation 01/02/22   mini-reassessment 12/03/21   Authorization Type [employer] Medicare, $20 copay    Authorization Time Period no visit limit    Progress Note Due on Visit 10    OT Start Time 1115    OT Stop Time 1154    OT Time Calculation (min) 39 min    Activity Tolerance Patient tolerated treatment well    Behavior During Therapy WFL for tasks assessed/performed               Past Medical History:  Diagnosis Date   Diabetes mellitus without complication ([place])    Hyperlipidemia    Hypertension    Past Surgical History:  Procedure Laterality Date   EYE SURGERY     LASIK     Patient Active Problem List   Diagnosis Date Noted   Weak urinary stream 04/10/2020   Benign prostatic hyperplasia with urinary obstruction 04/10/2020   Osteoarthritis of carpometacarpal ([place]) joint of thumb 12/20/2018   Diabetes mellitus ([place]) 12/20/2018   Hypertensive disorder 12/20/2018   Hypercholesterolemia 12/20/2018   Carpal tunnel syndrome of left wrist 12/20/2018   Sacroiliac joint pain 05/17/2018   Arthritis of wrist 07/19/2017    ONSET DATE: 10/15/21  REFERRING DIAG: S/P L SH Subacromial Decompression, DCE, biceps tenodesis, RCR  THERAPY DIAG:  Acute pain of left shoulder  Stiffness of left shoulder, not elsewhere classified  Other symptoms and signs involving the musculoskeletal system  Rationale for Evaluation and Treatment Rehabilitation  PERTINENT HISTORY: Pt is a 71 y/o male s/p left RCR, bicep tenodesis, and SAD, DCE on 10/15/21. Pt injured his LUE earlier this year while mountain biking. Pt presents with sling on, reports he wears most of the time but sometimes takes  off and lets his arm hang by his side. Has attempted to reach for a few items with discomfort.   PRECAUTIONS: NWB; shoulder-P/ROM for 6 weeks then AA/ROM and progress as tolerated  SUBJECTIVE: S: "It doesn't hurt at all. I let it out of my sling when I'm walking with my hand in my pocket."   Pt has an apt 8/28  PAIN:  Are you having pain? No  1/10 pain with movement or exercise    OBJECTIVE:   HAND DOMINANCE: Left   ADLs: Overall ADLs: Pt is having difficulty with dressing, reaching up and behind back, is unable to lift and carry items. Pt is sleeping in a recliner at this time. Pt is a Teaching laboratory technician, does a lot of yardwork.      FUNCTIONAL OUTCOME MEASURES: FOTO: 43/100     UPPER EXTREMITY ROM                  Assessed supine, er/IR adducted   Passive ROM Left eval  Shoulder flexion 112  Shoulder abduction 134  Shoulder internal rotation 90  Shoulder external rotation 34  (Blank rows = not tested)     UPPER EXTREMITY MMT:                  Not assessed due to protocol   MMT Left eval  Shoulder flexion    Shoulder abduction  Shoulder internal rotation    Shoulder external rotation    (Blank rows = not tested)      GOALS: Goals reviewed with patient? Yes   SHORT TERM GOALS: Target date: 12/01/2021     Pt will be provided with and educated on HEP to improve mobility required for use of LUE during ADL completion.    Goal status: IN PROGRESS   2.  Pt will increase LUE P/ROM to [facility] to improve ability to perform dressing and bathing tasks with minimal compensatory strategies.    Goal status: IN PROGRESS   3.  Pt will increase LUE strength to 3+/5 to improve ability to reach items located at waist to chest height during ADLs such as meal preparation and eating.    Goal status: IN PROGRESS     LONG TERM GOALS: Target date: 12/29/2021     Pt will decrease pain in LUE to 3/10 or less to improve ability to sleep in the bed for 3+ consecutive hours without  waking due to pain.    Goal status: IN PROGRESS   2.  Pt will decrease fascial restrictions in LUE to trace amounts to improve mobility required for functional reaching tasks.    Goal status: IN PROGRESS   3.  Pt will increase LUE A/ROM to [facility] to improve ability to reach for items overhead and behind back during dressing, bathing, and functional reaching tasks.    Goal status: IN PROGRESS   4.  Pt will increase LUE strength to 4+/5 or greater to improve ability to return to mountain biking and yardwork tasks.    Goal status: IN PROGRESS   5.  Pt will return to highest level of functioning using LUE as dominant during ADL and leisure tasks.  Baseline:  Goal status: IN PROGRESS    TODAY'S TREATMENT:   11/19/21 -Manual Therapy: Myofascial release, soft tissue mobilization, and trigger point manual therapy completed to left upper quadrant to decrease fascial restrictions and pain and allow for increased P/ROM -P/ROM: Supine, 1x10 flexion, abduction, horizontal abduction, and IR/er  -Scapular ROM: 1x10 retraction, elevation/depression, row -Pulleys: 2' flexion, 2' abduction  11/17/21 -Manual Therapy: Myofascial release, soft tissue mobilization, and trigger point manual therapy completed to left upper quadrant to decrease fascial restrictions and pain and allow for increased P/ROM -P/ROM: Supine, 1x10 flexion, abduction, horizontal abduction, and IR/er  -Scapular ROM: 1x10 retraction, elevation/depression, row -Pulleys: 1' flexion, 1' abduction    11/11/21 -Manual Therapy: Myofascial release, soft tissue mobilization, and trigger point manual therapy completed to left upper quadrant to decrease fascial restrictions and pain and allow for increased P/ROM -P/ROM: Supine, 1x10 flexion, abduction, horizontal abduction, and IR/er -Scapular ROM: 1x10 retraction, elevation/depression, row -Therapy ball: flexion, abduction, 10 reps each -Pulleys: 1' flexion, 1' abduction   PATIENT  EDUCATION: Education details: Scapular ROM  Person educated: Patient Education method: Explanation Education comprehension: verbalized understanding and returned demonstration   HOME EXERCISE PROGRAM Eval: Table slides 8/22: Scapular ROM  ASSESSMENT:   CLINICAL IMPRESSION: A: Pt reporting no pain, some soreness at anterior shoulder. Completed manual, most restriction and tenderness in axillary region, prior to progressing to P/ROM. Pt able to achieve approximately 165 degrees of flexion and 150 abduction with only slight discomfort at end range. Therapist cuing for upright posture with pulleys to prevent compensation with leaning. Tactile cues for muscle recruitment with scapular ROM. Pt shared that he will be going on vacation at the end of September, discussed the importance of adhering to protocol.  PLAN:  OT FREQUENCY: 2x/week   OT DURATION: 8 weeks   PLANNED INTERVENTIONS: self care/ADL training, therapeutic exercise, therapeutic activity, manual therapy, scar mobilization, passive range of motion, electrical stimulation, ultrasound, moist heat, cryotherapy, and patient/family education     CONSULTED AND AGREED WITH PLAN OF CARE: Patient   PLAN FOR NEXT SESSION: continue myofascial release, passive stretching. Follow protocol. Follow up on HEP. Progress to A/ROM on 8/31    Mathews Robinsons, OTR/L [phone]  11/19/2021, 12:05 PM

## 2021-11-23 DIAGNOSIS — M19012 Primary osteoarthritis, left shoulder: Secondary | ICD-10-CM | POA: Diagnosis not present

## 2021-11-24 ENCOUNTER — Encounter (HOSPITAL_COMMUNITY): Payer: Self-pay

## 2021-11-24 ENCOUNTER — Ambulatory Visit (HOSPITAL_COMMUNITY): Payer: Medicare Other

## 2021-11-24 DIAGNOSIS — M25512 Pain in left shoulder: Secondary | ICD-10-CM

## 2021-11-24 DIAGNOSIS — M25612 Stiffness of left shoulder, not elsewhere classified: Secondary | ICD-10-CM | POA: Diagnosis not present

## 2021-11-24 DIAGNOSIS — R29898 Other symptoms and signs involving the musculoskeletal system: Secondary | ICD-10-CM | POA: Diagnosis not present

## 2021-11-24 NOTE — Therapy (Signed)
OUTPATIENT OCCUPATIONAL THERAPY TREATMENT NOTE   Patient Name: Malik Bowen MRN: [id number] DOB:1950/12/05, 71 y.o., male Today's Date: 11/24/2021  PCP: Collene Mares, PA-C REFERRING PROVIDER: Edmonia Lynch, MD   OT End of Session - 11/24/21 1034     Visit Number 6    Number of Visits 16    Date for OT Re-Evaluation 01/02/22   mini-reassessment 12/03/21   Authorization Type [employer] Medicare, $20 copay    Authorization Time Period no visit limit    Progress Note Due on Visit 10    OT Start Time 1032    OT Stop Time 1115    OT Time Calculation (min) 43 min    Activity Tolerance Patient tolerated treatment well    Behavior During Therapy WFL for tasks assessed/performed               Past Medical History:  Diagnosis Date   Diabetes mellitus without complication ([place])    Hyperlipidemia    Hypertension    Past Surgical History:  Procedure Laterality Date   EYE SURGERY     LASIK     Patient Active Problem List   Diagnosis Date Noted   Weak urinary stream 04/10/2020   Benign prostatic hyperplasia with urinary obstruction 04/10/2020   Osteoarthritis of carpometacarpal ([place]) joint of thumb 12/20/2018   Diabetes mellitus ([place]) 12/20/2018   Hypertensive disorder 12/20/2018   Hypercholesterolemia 12/20/2018   Carpal tunnel syndrome of left wrist 12/20/2018   Sacroiliac joint pain 05/17/2018   Arthritis of wrist 07/19/2017    ONSET DATE: 10/15/21  REFERRING DIAG: S/P L SH Subacromial Decompression, DCE, biceps tenodesis, RCR  THERAPY DIAG:  Acute pain of left shoulder  Stiffness of left shoulder, not elsewhere classified  Other symptoms and signs involving the musculoskeletal system  Rationale for Evaluation and Treatment Rehabilitation  PERTINENT HISTORY: Pt is a 71 y/o male s/p left RCR, bicep tenodesis, and SAD, DCE on 10/15/21. Pt injured his LUE earlier this year while mountain biking. Pt presents with sling on, reports he wears most of the time but sometimes takes  off and lets his arm hang by his side. Has attempted to reach for a few items with discomfort.   PRECAUTIONS: NWB; shoulder-P/ROM for 6 weeks then AA/ROM and progress as tolerated  SUBJECTIVE: S: "Everything is fine. I slept without my sling and dr cleared me to mow the lawn."   Pt has an apt 8/28  PAIN:  Are you having pain? No  1/10 pain with movement or exercise    OBJECTIVE:   HAND DOMINANCE: Left   ADLs: Overall ADLs: Pt is having difficulty with dressing, reaching up and behind back, is unable to lift and carry items. Pt is sleeping in a recliner at this time. Pt is a Teaching laboratory technician, does a lot of yardwork.      FUNCTIONAL OUTCOME MEASURES: FOTO: 43/100     UPPER EXTREMITY ROM                  Assessed supine, er/IR adducted   Passive ROM Left eval  Shoulder flexion 112  Shoulder abduction 134  Shoulder internal rotation 90  Shoulder external rotation 34  (Blank rows = not tested)     UPPER EXTREMITY MMT:                  Not assessed due to protocol   MMT Left eval  Shoulder flexion    Shoulder abduction    Shoulder internal rotation  Shoulder external rotation    (Blank rows = not tested)      GOALS: Goals reviewed with patient? Yes   SHORT TERM GOALS: Target date: 12/01/2021     Pt will be provided with and educated on HEP to improve mobility required for use of LUE during ADL completion.    Goal status: IN PROGRESS   2.  Pt will increase LUE P/ROM to [facility] to improve ability to perform dressing and bathing tasks with minimal compensatory strategies.    Goal status: IN PROGRESS   3.  Pt will increase LUE strength to 3+/5 to improve ability to reach items located at waist to chest height during ADLs such as meal preparation and eating.    Goal status: IN PROGRESS     LONG TERM GOALS: Target date: 12/29/2021     Pt will decrease pain in LUE to 3/10 or less to improve ability to sleep in the bed for 3+ consecutive hours without waking due  to pain.    Goal status: IN PROGRESS   2.  Pt will decrease fascial restrictions in LUE to trace amounts to improve mobility required for functional reaching tasks.    Goal status: IN PROGRESS   3.  Pt will increase LUE A/ROM to [facility] to improve ability to reach for items overhead and behind back during dressing, bathing, and functional reaching tasks.    Goal status: IN PROGRESS   4.  Pt will increase LUE strength to 4+/5 or greater to improve ability to return to mountain biking and yardwork tasks.    Goal status: IN PROGRESS   5.  Pt will return to highest level of functioning using LUE as dominant during ADL and leisure tasks.  Baseline:  Goal status: IN PROGRESS    TODAY'S TREATMENT:   11/24/21 -Manual:  Myofascial release, soft tissue mobilization, and trigger point manual therapy completed to left upper quadrant to decrease fascial restrictions and pain and allow for increased ROM -P/ROM: supine, flexion, abduction, horizontal abduction/adduction, er/IR, 1x5 rep -AA/ROM: supine, flexion, abduction, horizontal abduction/adduction, er/IR, 1x5 rep -A/ROM: seated, flexion, abduction, horizontal abduction/adduction, er/IR, 1x15 rep -Wall walks: 1x5 reps with 5 sec hold at end range  -UBE: level 2, 3 mins forward  11/19/21 -Manual Therapy: Myofascial release, soft tissue mobilization, and trigger point manual therapy completed to left upper quadrant to decrease fascial restrictions and pain and allow for increased P/ROM -P/ROM: Supine, 1x10 flexion, abduction, horizontal abduction, and IR/er  -Scapular ROM: 1x10 retraction, elevation/depression, row -Pulleys: 2' flexion, 2' abduction  11/17/21 -Manual Therapy: Myofascial release, soft tissue mobilization, and trigger point manual therapy completed to left upper quadrant to decrease fascial restrictions and pain and allow for increased P/ROM -P/ROM: Supine, 1x10 flexion, abduction, horizontal abduction, and IR/er  -Scapular ROM:  1x10 retraction, elevation/depression, row -Pulleys: 1' flexion, 1' abduction   PATIENT EDUCATION: Education details: AA/ROM and A/ROM Person educated: Patient Education method: Explanation Education comprehension: verbalized understanding and returned demonstration   HOME EXERCISE PROGRAM Eval: Table slides 8/22: Scapular ROM 8/29: AA/ROM and A/ROM  ASSESSMENT:   CLINICAL IMPRESSION: A: Pt continues to report no pain and reports starting to use his LUE more at home with preparing for vacation in September. He tolerated manual well, with most restrictions and tenderness noted in biceps and axillary region. Pt reporting soreness in the deltoids with AA/ROM and A/ROM, especially with abduction and horizontal abduction. With A/ROM, therapist providing maximal verbal cuing to slow down movements and maintain proper body mechanics.  Discussed this importance with patient to adhere to protocol and using less weight at home with household tasks.   PLAN:  OT FREQUENCY: 2x/week   OT DURATION: 8 weeks   PLANNED INTERVENTIONS: self care/ADL training, therapeutic exercise, therapeutic activity, manual therapy, scar mobilization, passive range of motion, electrical stimulation, ultrasound, moist heat, cryotherapy, and patient/family education     CONSULTED AND AGREED WITH PLAN OF CARE: Patient   PLAN FOR NEXT SESSION: continue myofascial release, active assisted stretching. Follow protocol. Follow up on HEP. Continue A/ROM.     Flonnie Hailstone, [place], OTR/L [phone]  11/24/2021, 12:36 PM

## 2021-11-24 NOTE — Patient Instructions (Signed)
Perform each exercise ____10-15____ reps. 2-3x days.   1) Protraction   Start by holding a wand or cane at chest height.  Next, slowly push the wand outwards in front of your body so that your elbows become fully straightened. Then, return to the original position.     2) Shoulder FLEXION   In the standing position, hold a wand/cane with both arms, palms down on both sides. Raise up the wand/cane allowing your unaffected arm to perform most of the effort. Your affected arm should be partially relaxed.      3) Internal/External ROTATION   In the standing position, hold a wand/cane with both hands keeping your elbows bent. Move your arms and wand/cane to one side.  Your affected arm should be partially relaxed while your unaffected arm performs most of the effort.       4) Shoulder ABDUCTION   While holding a wand/cane palm face up on the injured side and palm face down on the uninjured side, slowly raise up your injured arm to the side.        5) Horizontal Abduction/Adduction      Straight arms holding cane at shoulder height, bring cane to right, center, left. Repeat starting to left.   Copyright  [employer]. All rights reserved.           Repeat all exercises 15 times, 2 times per day.  1) Shoulder Protraction    Begin with elbows by your side, slowly "punch" straight out in front of you.      2) Shoulder Flexion  Supine:     Standing:         Begin with arms at your side with thumbs pointed up, slowly raise both arms up and forward towards overhead.               3) Horizontal abduction/adduction  Supine:   Standing:           Begin with arms straight out in front of you, bring out to the side in at "T" shape. Keep arms straight entire time.                 4) Internal & External Rotation   Supine:     Standing:     Stand with elbows at the side and elbows bent 90 degrees. Move your forearms away from your  body, then bring back inward toward the body.     5) Shoulder Abduction  Supine:     Standing:       Lying on your back begin with your arms flat on the table next to your side. Slowly move your arms out to the side so that they go overhead, in a jumping jack or snow angel movement.

## 2021-11-25 DIAGNOSIS — M9905 Segmental and somatic dysfunction of pelvic region: Secondary | ICD-10-CM | POA: Diagnosis not present

## 2021-11-25 DIAGNOSIS — M546 Pain in thoracic spine: Secondary | ICD-10-CM | POA: Diagnosis not present

## 2021-11-25 DIAGNOSIS — M9902 Segmental and somatic dysfunction of thoracic region: Secondary | ICD-10-CM | POA: Diagnosis not present

## 2021-11-25 DIAGNOSIS — M5442 Lumbago with sciatica, left side: Secondary | ICD-10-CM | POA: Diagnosis not present

## 2021-11-25 DIAGNOSIS — M9903 Segmental and somatic dysfunction of lumbar region: Secondary | ICD-10-CM | POA: Diagnosis not present

## 2021-11-26 ENCOUNTER — Ambulatory Visit (HOSPITAL_COMMUNITY): Payer: Medicare Other

## 2021-11-26 ENCOUNTER — Encounter (HOSPITAL_COMMUNITY): Payer: Self-pay

## 2021-11-26 DIAGNOSIS — M25612 Stiffness of left shoulder, not elsewhere classified: Secondary | ICD-10-CM | POA: Diagnosis not present

## 2021-11-26 DIAGNOSIS — M25512 Pain in left shoulder: Secondary | ICD-10-CM

## 2021-11-26 DIAGNOSIS — R29898 Other symptoms and signs involving the musculoskeletal system: Secondary | ICD-10-CM | POA: Diagnosis not present

## 2021-11-26 NOTE — Therapy (Signed)
OUTPATIENT OCCUPATIONAL THERAPY TREATMENT NOTE   Patient Name: Cadden Elizondo MRN: [id number] DOB:[birth year], 71 y.o., male Today's Date: 11/26/2021  PCP: Collene Mares, PA-C REFERRING PROVIDER: Edmonia Lynch, MD   OT End of Session - 11/26/21 1122     Visit Number 7    Number of Visits 16    Date for OT Re-Evaluation 01/02/22   mini-reassessment 12/03/21   Authorization Type [employer] Medicare, $20 copay    Authorization Time Period no visit limit    Progress Note Due on Visit 10    OT Start Time 1119    OT Stop Time 1200    OT Time Calculation (min) 41 min    Activity Tolerance Patient tolerated treatment well    Behavior During Therapy WFL for tasks assessed/performed               Past Medical History:  Diagnosis Date   Diabetes mellitus without complication ([place])    Hyperlipidemia    Hypertension    Past Surgical History:  Procedure Laterality Date   EYE SURGERY     LASIK     Patient Active Problem List   Diagnosis Date Noted   Weak urinary stream 04/10/2020   Benign prostatic hyperplasia with urinary obstruction 04/10/2020   Osteoarthritis of carpometacarpal ([place]) joint of thumb 12/20/2018   Diabetes mellitus ([place]) 12/20/2018   Hypertensive disorder 12/20/2018   Hypercholesterolemia 12/20/2018   Carpal tunnel syndrome of left wrist 12/20/2018   Sacroiliac joint pain 05/17/2018   Arthritis of wrist 07/19/2017    ONSET DATE: 10/15/21  REFERRING DIAG: S/P L SH Subacromial Decompression, DCE, biceps tenodesis, RCR  THERAPY DIAG:  Acute pain of left shoulder  Stiffness of left shoulder, not elsewhere classified  Other symptoms and signs involving the musculoskeletal system  Rationale for Evaluation and Treatment Rehabilitation  PERTINENT HISTORY: Pt is a 71 y/o male s/p left RCR, bicep tenodesis, and SAD, DCE on 10/15/21. Pt injured his LUE earlier this year while mountain biking. Pt presents with sling on, reports he wears most of the time but sometimes takes  off and lets his arm hang by his side. Has attempted to reach for a few items with discomfort.   PRECAUTIONS: NWB; shoulder-P/ROM for 6 weeks then AA/ROM and progress as tolerated  SUBJECTIVE: S: "it has been hurting lately."   Pt has an apt 8/28  PAIN:  Are you having pain? Yes: NPRS scale: 1/10 Pain location: Left shoulder- anterior and deltoid  Pain description: Throbbing Aggravating factors: movement  Relieving factors: -  3/10 pain with movement     OBJECTIVE:   HAND DOMINANCE: Left   ADLs: Overall ADLs: Pt is having difficulty with dressing, reaching up and behind back, is unable to lift and carry items. Pt is sleeping in a recliner at this time. Pt is a Teaching laboratory technician, does a lot of yardwork.      FUNCTIONAL OUTCOME MEASURES: FOTO: 43/100     UPPER EXTREMITY ROM                  Assessed supine, er/IR adducted   Passive ROM Left eval  Shoulder flexion 112  Shoulder abduction 134  Shoulder internal rotation 90  Shoulder external rotation 34  (Blank rows = not tested)     UPPER EXTREMITY MMT:                  Not assessed due to protocol   MMT Left eval  Shoulder flexion    Shoulder  abduction    Shoulder internal rotation    Shoulder external rotation    (Blank rows = not tested)      GOALS: Goals reviewed with patient? Yes   SHORT TERM GOALS: Target date: 12/01/2021     Pt will be provided with and educated on HEP to improve mobility required for use of LUE during ADL completion.    Goal status: IN PROGRESS   2.  Pt will increase LUE P/ROM to [facility] to improve ability to perform dressing and bathing tasks with minimal compensatory strategies.    Goal status: IN PROGRESS   3.  Pt will increase LUE strength to 3+/5 to improve ability to reach items located at waist to chest height during ADLs such as meal preparation and eating.    Goal status: IN PROGRESS     LONG TERM GOALS: Target date: 12/29/2021     Pt will decrease pain in LUE to 3/10  or less to improve ability to sleep in the bed for 3+ consecutive hours without waking due to pain.    Goal status: IN PROGRESS   2.  Pt will decrease fascial restrictions in LUE to trace amounts to improve mobility required for functional reaching tasks.    Goal status: IN PROGRESS   3.  Pt will increase LUE A/ROM to [facility] to improve ability to reach for items overhead and behind back during dressing, bathing, and functional reaching tasks.    Goal status: IN PROGRESS   4.  Pt will increase LUE strength to 4+/5 or greater to improve ability to return to mountain biking and yardwork tasks.    Goal status: IN PROGRESS   5.  Pt will return to highest level of functioning using LUE as dominant during ADL and leisure tasks.  Baseline:  Goal status: IN PROGRESS    TODAY'S TREATMENT:   11/26/21 -Manual:  Myofascial release, soft tissue mobilization, and trigger point manual therapy completed to left upper quadrant to decrease fascial restrictions and pain and allow for increased ROM -P/ROM: supine, flexion, abduction, horizontal abduction/adduction, er/IR, 1x5 rep -AA/ROM: supine, flexion and protraction, Seated abduction, horizontal abduction/adduction, er/IR, 1x10 rep -A/ROM: standing, flexion, abduction, er/IR, 1x15 -Proximal shoulder strength: ABC's x1, rest break at "L" -Wall washing: 1x10 with 5 sec hold, flexion, abduction  11/24/21 -Manual:  Myofascial release, soft tissue mobilization, and trigger point manual therapy completed to left upper quadrant to decrease fascial restrictions and pain and allow for increased ROM -P/ROM: supine, flexion, abduction, horizontal abduction/adduction, er/IR, 1x5 rep -AA/ROM: supine, flexion, abduction, horizontal abduction/adduction, er/IR, 1x5 rep -A/ROM: seated, flexion, abduction, horizontal abduction/adduction, er/IR, 1x15 rep -Wall walks: 1x5 reps with 5 sec hold at end range  -UBE: level 2, 3 mins forward  11/19/21 -Manual Therapy:  Myofascial release, soft tissue mobilization, and trigger point manual therapy completed to left upper quadrant to decrease fascial restrictions and pain and allow for increased P/ROM -P/ROM: Supine, 1x10 flexion, abduction, horizontal abduction, and IR/er  -Scapular ROM: 1x10 retraction, elevation/depression, row -Pulleys: 2' flexion, 2' abduction   PATIENT EDUCATION: Education details: AA/ROM and A/ROM Person educated: Patient Education method: Explanation Education comprehension: verbalized understanding and returned demonstration   HOME EXERCISE PROGRAM Eval: Table slides 8/22: Scapular ROM 8/29: AA/ROM and A/ROM  ASSESSMENT:   CLINICAL IMPRESSION: A:  Pt reports that he has had some increased pain at the anterior shoulder since beginning AA/ROM and A/ROM, no more than 3/10 pain and dissipates after conclusion of exercises. Explained that some pain is  expected with increased movement. Minimal fascial restrictions noted today, pt reporting that he received a massage yesterday. Pt tolerating AA/ROM well and reporting minimal pain increase with A/ROM. Therapist providing verbal and tactile cuing throughout A/ROM for proper body mechanics and positioning to limit shoulder hiking. During proximal shoulder strengthening, pt fatigued halfway through and required a rest break before finishing. Pt required mod verbal cuing to slow down during all exercises due to rushing and therapist continues to discuss importance of following his protocol for using less weight at home during IADL's.   PLAN:  OT FREQUENCY: 2x/week   OT DURATION: 8 weeks   PLANNED INTERVENTIONS: self care/ADL training, therapeutic exercise, therapeutic activity, manual therapy, scar mobilization, passive range of motion, electrical stimulation, ultrasound, moist heat, cryotherapy, and patient/family education     CONSULTED AND AGREED WITH PLAN OF CARE: Patient   PLAN FOR NEXT SESSION: continue myofascial release, active  assisted stretching. Follow protocol. Follow up on HEP. Continue A/ROM. Add scapular strengthening    Mathews Robinsons, OTR/L [phone]  11/26/2021, 2:42 PM

## 2021-12-01 ENCOUNTER — Ambulatory Visit (HOSPITAL_COMMUNITY): Payer: Medicare Other | Attending: Orthopedic Surgery | Admitting: Occupational Therapy

## 2021-12-01 ENCOUNTER — Encounter (HOSPITAL_COMMUNITY): Payer: Self-pay | Admitting: Occupational Therapy

## 2021-12-01 DIAGNOSIS — R29898 Other symptoms and signs involving the musculoskeletal system: Secondary | ICD-10-CM | POA: Insufficient documentation

## 2021-12-01 DIAGNOSIS — M25512 Pain in left shoulder: Secondary | ICD-10-CM | POA: Insufficient documentation

## 2021-12-01 DIAGNOSIS — M25612 Stiffness of left shoulder, not elsewhere classified: Secondary | ICD-10-CM | POA: Diagnosis not present

## 2021-12-01 NOTE — Therapy (Signed)
OUTPATIENT OCCUPATIONAL THERAPY TREATMENT NOTE   Patient Name: Malik Bowen MRN: [id number] DOB:1950/08/06, 71 y.o., male Today's Date: 12/01/2021  PCP: Collene Mares, PA-C REFERRING PROVIDER: Edmonia Lynch, MD   OT End of Session - 12/01/21 1458     Visit Number 8    Number of Visits 16    Date for OT Re-Evaluation 01/02/22   mini-reassessment 12/03/21   Authorization Type [employer] Medicare, $20 copay    Authorization Time Period no visit limit    Progress Note Due on Visit 10    OT Start Time 1305    OT Stop Time 1345    OT Time Calculation (min) 40 min    Activity Tolerance Patient tolerated treatment well    Behavior During Therapy [facility] for tasks assessed/performed                Past Medical History:  Diagnosis Date   Diabetes mellitus without complication ([place])    Hyperlipidemia    Hypertension    Past Surgical History:  Procedure Laterality Date   EYE SURGERY     LASIK     Patient Active Problem List   Diagnosis Date Noted   Weak urinary stream 04/10/2020   Benign prostatic hyperplasia with urinary obstruction 04/10/2020   Osteoarthritis of carpometacarpal ([place]) joint of thumb 12/20/2018   Diabetes mellitus ([place]) 12/20/2018   Hypertensive disorder 12/20/2018   Hypercholesterolemia 12/20/2018   Carpal tunnel syndrome of left wrist 12/20/2018   Sacroiliac joint pain 05/17/2018   Arthritis of wrist 07/19/2017    ONSET DATE: 10/15/21  REFERRING DIAG: S/P L SH Subacromial Decompression, DCE, biceps tenodesis, RCR  THERAPY DIAG:  Acute pain of left shoulder  Stiffness of left shoulder, not elsewhere classified  Other symptoms and signs involving the musculoskeletal system  Rationale for Evaluation and Treatment Rehabilitation  PERTINENT HISTORY: Pt is a 71 y/o male s/p left RCR, bicep tenodesis, and SAD, DCE on 10/15/21. Pt injured his LUE earlier this year while mountain biking. Pt presents with sling on, reports he wears most of the time but sometimes takes  off and lets his arm hang by his side. Has attempted to reach for a few items with discomfort.   PRECAUTIONS: NWB; shoulder-P/ROM for 6 weeks then AA/ROM and progress as tolerated  SUBJECTIVE: S: "I started cycling again this weekend on my electric bike!"   Pt has an apt 8/28  PAIN:  Are you having pain? Yes: NPRS scale: 3/10 Pain location: Left shoulder- anterior and deltoid  Pain description: Throbbing Aggravating factors: movement  Relieving factors: -  OBJECTIVE:   HAND DOMINANCE: Left   ADLs: Overall ADLs: Pt is having difficulty with dressing, reaching up and behind back, is unable to lift and carry items. Pt is sleeping in a recliner at this time. Pt is a Teaching laboratory technician, does a lot of yardwork.      FUNCTIONAL OUTCOME MEASURES: FOTO: 43/100     UPPER EXTREMITY ROM                  Assessed supine, er/IR adducted   Passive ROM Left eval  Shoulder flexion 112  Shoulder abduction 134  Shoulder internal rotation 90  Shoulder external rotation 34  (Blank rows = not tested)     UPPER EXTREMITY MMT:                  Not assessed due to protocol   MMT Left eval  Shoulder flexion    Shoulder abduction  Shoulder internal rotation    Shoulder external rotation    (Blank rows = not tested)      GOALS: Goals reviewed with patient? Yes   SHORT TERM GOALS: Target date: 12/01/2021     Pt will be provided with and educated on HEP to improve mobility required for use of LUE during ADL completion.    Goal status: IN PROGRESS   2.  Pt will increase LUE P/ROM to [facility] to improve ability to perform dressing and bathing tasks with minimal compensatory strategies.    Goal status: IN PROGRESS   3.  Pt will increase LUE strength to 3+/5 to improve ability to reach items located at waist to chest height during ADLs such as meal preparation and eating.    Goal status: IN PROGRESS     LONG TERM GOALS: Target date: 12/29/2021     Pt will decrease pain in LUE to 3/10 or  less to improve ability to sleep in the bed for 3+ consecutive hours without waking due to pain.    Goal status: IN PROGRESS   2.  Pt will decrease fascial restrictions in LUE to trace amounts to improve mobility required for functional reaching tasks.    Goal status: IN PROGRESS   3.  Pt will increase LUE A/ROM to [facility] to improve ability to reach for items overhead and behind back during dressing, bathing, and functional reaching tasks.    Goal status: IN PROGRESS   4.  Pt will increase LUE strength to 4+/5 or greater to improve ability to return to mountain biking and yardwork tasks.    Goal status: IN PROGRESS   5.  Pt will return to highest level of functioning using LUE as dominant during ADL and leisure tasks.  Baseline:  Goal status: IN PROGRESS    TODAY'S TREATMENT:   12/01/21 -Manual:  Myofascial release, soft tissue mobilization, and trigger point manual therapy completed to left upper quadrant to decrease fascial restrictions and pain and allow for increased ROM -P/ROM: supine, flexion, abduction, horizontal abduction/adduction, er/IR, 1x5 rep -A/ROM: supine, flexion, abduction, er/IR 1x10 -isometrics: flexion, extension, adduction, er/IR, 5x5" each -tennis ball massage: focus on deltoids, biceps, and tricep 1x30" each muscle group -Wall washing: 1x10 with 10 sec hold, flexion, abduction -UBE: level 2, 3' forward, 3' backward  11/26/21 -Manual:  Myofascial release, soft tissue mobilization, and trigger point manual therapy completed to left upper quadrant to decrease fascial restrictions and pain and allow for increased ROM -P/ROM: supine, flexion, abduction, horizontal abduction/adduction, er/IR, 1x5 rep -AA/ROM: supine, flexion and protraction, Seated abduction, horizontal abduction/adduction, er/IR, 1x10 rep -A/ROM: standing, flexion, abduction, er/IR, 1x15 -Proximal shoulder strength: ABC's x1, rest break at "L" -Wall washing: 1x10 with 5 sec hold, flexion,  abduction  11/24/21 -Manual:  Myofascial release, soft tissue mobilization, and trigger point manual therapy completed to left upper quadrant to decrease fascial restrictions and pain and allow for increased ROM -P/ROM: supine, flexion, abduction, horizontal abduction/adduction, er/IR, 1x5 rep -AA/ROM: supine, flexion, abduction, horizontal abduction/adduction, er/IR, 1x5 rep -A/ROM: seated, flexion, abduction, horizontal abduction/adduction, er/IR, 1x15 rep -Wall walks: 1x5 reps with 5 sec hold at end range  -UBE: level 2, 3 mins forward  PATIENT EDUCATION: Education details: Education administrator Person educated: Patient Education method: Explanation Education comprehension: verbalized understanding and returned demonstration   HOME EXERCISE PROGRAM Eval: Table slides 8/22: Scapular ROM 8/29: AA/ROM and A/ROM 9/5: Tennis Ball trigger point massage  ASSESSMENT:   CLINICAL IMPRESSION: A: This session  focused on A/ROM with isometric strengthening and endurance. Pt having increased pain and tightness through the deltoids and biceps. Therapist providing verbal and tactile cuing to decrease shoulder hiking and to encourage proper body mechanics with A/ROM. Additionally, therapist provided tennis ball for trigger point massage on deltoid, biceps, and triceps. Pt with increased pain throughout the session, most likely due to muscle tightness, as well as increased fatigue with shoulder muscles.   PLAN:  OT FREQUENCY: 2x/week   OT DURATION: 8 weeks   PLANNED INTERVENTIONS: self care/ADL training, therapeutic exercise, therapeutic activity, manual therapy, scar mobilization, passive range of motion, electrical stimulation, ultrasound, moist heat, cryotherapy, and patient/family education     CONSULTED AND AGREED WITH PLAN OF CARE: Patient   PLAN FOR NEXT SESSION: continue myofascial release, active assisted stretching. Follow protocol. Follow up on HEP. Continue A/ROM. Add  scapular strengthening    Mathews Robinsons, OTR/L [phone]  12/01/2021, 2:59 PM

## 2021-12-03 ENCOUNTER — Ambulatory Visit (HOSPITAL_COMMUNITY): Payer: Medicare Other | Admitting: Occupational Therapy

## 2021-12-03 ENCOUNTER — Encounter (HOSPITAL_COMMUNITY): Payer: Self-pay | Admitting: Occupational Therapy

## 2021-12-03 DIAGNOSIS — R29898 Other symptoms and signs involving the musculoskeletal system: Secondary | ICD-10-CM | POA: Diagnosis not present

## 2021-12-03 DIAGNOSIS — M25512 Pain in left shoulder: Secondary | ICD-10-CM | POA: Diagnosis not present

## 2021-12-03 DIAGNOSIS — M25612 Stiffness of left shoulder, not elsewhere classified: Secondary | ICD-10-CM | POA: Diagnosis not present

## 2021-12-03 NOTE — Therapy (Signed)
OUTPATIENT OCCUPATIONAL THERAPY TREATMENT NOTE   Patient Name: Malik Bowen MRN: [id number] DOB:[birth year], 71 y.o., male Today's Date: 12/03/2021  PCP: Collene Mares, PA-C REFERRING PROVIDER: Edmonia Lynch, MD   OT End of Session - 12/03/21 1434     Visit Number 9    Number of Visits 16    Date for OT Re-Evaluation 01/02/22   mini-reassessment 12/08/21   Authorization Type [employer] Medicare, $20 copay    Authorization Time Period no visit limit    Progress Note Due on Visit 10    OT Start Time 1307    OT Stop Time 1351    OT Time Calculation (min) 44 min    Activity Tolerance Patient tolerated treatment well    Behavior During Therapy WFL for tasks assessed/performed                 Past Medical History:  Diagnosis Date   Diabetes mellitus without complication ([place])    Hyperlipidemia    Hypertension    Past Surgical History:  Procedure Laterality Date   EYE SURGERY     LASIK     Patient Active Problem List   Diagnosis Date Noted   Weak urinary stream 04/10/2020   Benign prostatic hyperplasia with urinary obstruction 04/10/2020   Osteoarthritis of carpometacarpal ([place]) joint of thumb 12/20/2018   Diabetes mellitus ([place]) 12/20/2018   Hypertensive disorder 12/20/2018   Hypercholesterolemia 12/20/2018   Carpal tunnel syndrome of left wrist 12/20/2018   Sacroiliac joint pain 05/17/2018   Arthritis of wrist 07/19/2017    ONSET DATE: 10/15/21  REFERRING DIAG: S/P L SH Subacromial Decompression, DCE, biceps tenodesis, RCR  THERAPY DIAG:  Acute pain of left shoulder  Stiffness of left shoulder, not elsewhere classified  Other symptoms and signs involving the musculoskeletal system  Rationale for Evaluation and Treatment Rehabilitation  PERTINENT HISTORY: Pt is a 71 y/o male s/p left RCR, bicep tenodesis, and SAD, DCE on 10/15/21. Pt injured his LUE earlier this year while mountain biking. Pt presents with sling on, reports he wears most of the time but sometimes  takes off and lets his arm hang by his side. Has attempted to reach for a few items with discomfort.   PRECAUTIONS: NWB; shoulder-P/ROM for 6 weeks then AA/ROM and progress as tolerated  SUBJECTIVE: S: "I've been changing the oil on my vehicles and getting the RV ready for our roadtrip, my arm hasn't given me any troubles."   Pt has an apt 8/28  PAIN:  Are you having pain? Yes: NPRS scale: 1/10 Pain location: Left shoulder- anterior and deltoid  Pain description: Throbbing Aggravating factors: movement  Relieving factors: -  OBJECTIVE:   HAND DOMINANCE: Left   ADLs: Overall ADLs: Pt is having difficulty with dressing, reaching up and behind back, is unable to lift and carry items. Pt is sleeping in a recliner at this time. Pt is a Teaching laboratory technician, does a lot of yardwork.      FUNCTIONAL OUTCOME MEASURES: FOTO: 43/100     UPPER EXTREMITY ROM                  Assessed supine, er/IR adducted   Passive ROM Left eval  Shoulder flexion 112  Shoulder abduction 134  Shoulder internal rotation 90  Shoulder external rotation 34  (Blank rows = not tested)     UPPER EXTREMITY MMT:                  Not assessed due to protocol  MMT Left eval  Shoulder flexion    Shoulder abduction    Shoulder internal rotation    Shoulder external rotation    (Blank rows = not tested)      GOALS: Goals reviewed with patient? Yes   SHORT TERM GOALS: Target date: 12/01/2021     Pt will be provided with and educated on HEP to improve mobility required for use of LUE during ADL completion.    Goal status: IN PROGRESS   2.  Pt will increase LUE P/ROM to [facility] to improve ability to perform dressing and bathing tasks with minimal compensatory strategies.    Goal status: IN PROGRESS   3.  Pt will increase LUE strength to 3+/5 to improve ability to reach items located at waist to chest height during ADLs such as meal preparation and eating.    Goal status: IN PROGRESS     LONG TERM  GOALS: Target date: 12/29/2021     Pt will decrease pain in LUE to 3/10 or less to improve ability to sleep in the bed for 3+ consecutive hours without waking due to pain.    Goal status: IN PROGRESS   2.  Pt will decrease fascial restrictions in LUE to trace amounts to improve mobility required for functional reaching tasks.    Goal status: IN PROGRESS   3.  Pt will increase LUE A/ROM to [facility] to improve ability to reach for items overhead and behind back during dressing, bathing, and functional reaching tasks.    Goal status: IN PROGRESS   4.  Pt will increase LUE strength to 4+/5 or greater to improve ability to return to mountain biking and yardwork tasks.    Goal status: IN PROGRESS   5.  Pt will return to highest level of functioning using LUE as dominant during ADL and leisure tasks.  Baseline:  Goal status: IN PROGRESS    TODAY'S TREATMENT:   12/03/21 -Manual: Myofascial release, soft tissue mobilization, and trigger point manual therapy completed to left upper quadrant to decrease fascial restrictions and pain and allow for increased ROM -P/ROM: supine, flexion, abduction, er/IR, 1x5 with prolonged 10 second hold at end range.  -A/ROM: standing, flexion, abduction, protraction, er/IR, 1x10 -Wall Washing: flexion and abduction 3x20" stretches -Scapula Strengthening: green theraband, retraction, rows, and extension, 2x15 -functional reaching: using the top 2 cabinet shelves, rearranging multiple food items on both shelves, 1x3' -Shoulder endurance: overhead lacing, 1x3' -UBE: level 3, 2 mins forward, 2 mins backward  12/01/21 -Manual:  Myofascial release, soft tissue mobilization, and trigger point manual therapy completed to left upper quadrant to decrease fascial restrictions and pain and allow for increased ROM -P/ROM: supine, flexion, abduction, horizontal abduction/adduction, er/IR, 1x5 rep -A/ROM: supine, flexion, abduction, er/IR 1x10 -isometrics: flexion, extension,  adduction, er/IR, 5x5" each -tennis ball massage: focus on deltoids, biceps, and tricep 1x30" each muscle group -Wall washing: 1x10 with 10 sec hold, flexion, abduction -UBE: level 2, 3' forward, 3' backward  11/26/21 -Manual:  Myofascial release, soft tissue mobilization, and trigger point manual therapy completed to left upper quadrant to decrease fascial restrictions and pain and allow for increased ROM -P/ROM: supine, flexion, abduction, horizontal abduction/adduction, er/IR, 1x5 rep -AA/ROM: supine, flexion and protraction, Seated abduction, horizontal abduction/adduction, er/IR, 1x10 rep -A/ROM: standing, flexion, abduction, er/IR, 1x15 -Proximal shoulder strength: ABC's x1, rest break at "L" -Wall washing: 1x10 with 5 sec hold, flexion, abduction   PATIENT EDUCATION: Education details: Physiological scientist Person educated: Patient Education method: Explanation, demonstration, and handout  Education comprehension: verbalized understanding and returned demonstration   HOME EXERCISE PROGRAM Eval: Table slides 8/22: Scapular ROM 8/29: AA/ROM and A/ROM 9/5: Tennis Ball trigger point massage 9/7: Scapula strengthening (green theraband)  ASSESSMENT:   CLINICAL IMPRESSION: A: Pt demonstrating good improvements with strengthening and endurance this session. His A/ROM is progressing well, with trace amounts of pain. Minimal fascial restrictions noted in trapezius and axillary region, addressed by manual therapy to decrease pain and improve ROM. Therapist added proximal shoulder endurance tasks this session, via functional reaching and overhead lacing, in which pt required a rest break between activities and reports his LUE feeling very fatigued at the end of the session. Additionally, therapist initiated scapular strengthening exercises to begin strengthening "helper" muscles and encourage their activation to lessen stress on the rotator cuff muscles while they continue to heal. Pt overall  is please with his progress and following his HEP well. Therapist providing continued education to take it easy and follow his precautions/ 5lb weight limit at home.   PLAN:  OT FREQUENCY: 2x/week   OT DURATION: 8 weeks   PLANNED INTERVENTIONS: self care/ADL training, therapeutic exercise, therapeutic activity, manual therapy, scar mobilization, passive range of motion, electrical stimulation, ultrasound, moist heat, cryotherapy, and patient/family education     CONSULTED AND AGREED WITH PLAN OF CARE: Patient   PLAN FOR NEXT SESSION: continue myofascial release, active assisted stretching. Follow protocol. Continue A/ROM and scapular strengthening. Add shoulder strengthening.    Paulita Fujita, OTR/L [phone]  12/03/2021, 2:38 PM

## 2021-12-03 NOTE — Patient Instructions (Signed)

## 2021-12-08 ENCOUNTER — Encounter (HOSPITAL_COMMUNITY): Payer: Self-pay | Admitting: Occupational Therapy

## 2021-12-08 ENCOUNTER — Ambulatory Visit (HOSPITAL_COMMUNITY): Payer: Medicare Other | Admitting: Occupational Therapy

## 2021-12-08 DIAGNOSIS — R29898 Other symptoms and signs involving the musculoskeletal system: Secondary | ICD-10-CM

## 2021-12-08 DIAGNOSIS — M9905 Segmental and somatic dysfunction of pelvic region: Secondary | ICD-10-CM | POA: Diagnosis not present

## 2021-12-08 DIAGNOSIS — M9903 Segmental and somatic dysfunction of lumbar region: Secondary | ICD-10-CM | POA: Diagnosis not present

## 2021-12-08 DIAGNOSIS — M5442 Lumbago with sciatica, left side: Secondary | ICD-10-CM | POA: Diagnosis not present

## 2021-12-08 DIAGNOSIS — M9902 Segmental and somatic dysfunction of thoracic region: Secondary | ICD-10-CM | POA: Diagnosis not present

## 2021-12-08 DIAGNOSIS — M25612 Stiffness of left shoulder, not elsewhere classified: Secondary | ICD-10-CM | POA: Diagnosis not present

## 2021-12-08 DIAGNOSIS — M25512 Pain in left shoulder: Secondary | ICD-10-CM | POA: Diagnosis not present

## 2021-12-08 DIAGNOSIS — M546 Pain in thoracic spine: Secondary | ICD-10-CM | POA: Diagnosis not present

## 2021-12-08 NOTE — Therapy (Addendum)
OUTPATIENT OCCUPATIONAL THERAPY TREATMENT NOTE [place] AND PROGRESS NOTE   Patient Name: Malik Bowen MRN: [id number] DOB:May 06, 1950, 71 y.o., male Today's Date: 12/08/2021   Progress Note Reporting Period 11/03/21 to 12/08/21  See note below for Objective Data and Assessment of Progress/Goals.       PCP: Collene Mares, PA-C REFERRING PROVIDER: Edmonia Lynch, MD   OT End of Session - 12/08/21 1538     Visit Number 10    Number of Visits 16    Date for OT Re-Evaluation 01/02/22    Authorization Type [employer] Medicare, $20 copay    Authorization Time Period no visit limit    Progress Note Due on Visit 10    OT Start Time 1354   arrived late   OT Stop Time 1429    OT Time Calculation (min) 35 min    Activity Tolerance Patient tolerated treatment well    Behavior During Therapy [facility] for tasks assessed/performed                  Past Medical History:  Diagnosis Date   Diabetes mellitus without complication ([place])    Hyperlipidemia    Hypertension    Past Surgical History:  Procedure Laterality Date   EYE SURGERY     LASIK     Patient Active Problem List   Diagnosis Date Noted   Weak urinary stream 04/10/2020   Benign prostatic hyperplasia with urinary obstruction 04/10/2020   Osteoarthritis of carpometacarpal ([facility]) joint of thumb 12/20/2018   Diabetes mellitus ([place]) 12/20/2018   Hypertensive disorder 12/20/2018   Hypercholesterolemia 12/20/2018   Carpal tunnel syndrome of left wrist 12/20/2018   Sacroiliac joint pain 05/17/2018   Arthritis of wrist 07/19/2017    ONSET DATE: 10/15/21  REFERRING DIAG: S/P L SH Subacromial Decompression, DCE, biceps tenodesis, RCR  THERAPY DIAG:  Acute pain of left shoulder  Stiffness of left shoulder, not elsewhere classified  Other symptoms and signs involving the musculoskeletal system  Rationale for Evaluation and Treatment Rehabilitation  PERTINENT HISTORY: Pt is a 71 y/o male s/p left RCR, bicep tenodesis,  and SAD, DCE on 10/15/21. Pt injured his LUE earlier this year while mountain biking. Pt presents with sling on, reports he wears most of the time but sometimes takes off and lets his arm hang by his side. Has attempted to reach for a few items with discomfort.   PRECAUTIONS: progress as tolerated  SUBJECTIVE: S: "I've been weedeating."  Pt has an apt 8/28  PAIN:  Are you having pain? Yes: NPRS scale: 1/10 Pain location: Left shoulder- anterior and deltoid  Pain description: Throbbing Aggravating factors: movement  Relieving factors: -  OBJECTIVE:   HAND DOMINANCE: Left   ADLs: Overall ADLs: Pt is having difficulty with dressing, reaching up and behind back, is unable to lift and carry items. Pt is sleeping in a recliner at this time. Pt is a Teaching laboratory technician, does a lot of yardwork.      FUNCTIONAL OUTCOME MEASURES: FOTO: 43/100  9/12: 70/100   UPPER EXTREMITY ROM                  Assessed supine, er/IR adducted   Passive ROM Left eval Left 12/08/21  Shoulder flexion 112 161  Shoulder abduction 134 170  Shoulder internal rotation 90 90  Shoulder external rotation 34 80  (Blank rows = not tested)       Active ROM Left eval Left 12/08/21  Shoulder flexion  151  Shoulder abduction  145  Shoulder internal rotation  90  Shoulder external rotation  67  (Blank rows = not tested)   UPPER EXTREMITY MMT:                  Not assessed due to protocol   MMT Left eval Left 12/08/21  Shoulder flexion   4-/5  Shoulder abduction   4-/5  Shoulder internal rotation   5/5  Shoulder external rotation   4-/5  (Blank rows = not tested)      GOALS: Goals reviewed with patient? Yes   SHORT TERM GOALS: Target date: 12/01/2021     Pt will be provided with and educated on HEP to improve mobility required for use of LUE during ADL completion.    Goal status: MET   2.  Pt will increase LUE P/ROM to [facility] to improve ability to perform dressing and bathing tasks with minimal  compensatory strategies.    Goal status: MET   3.  Pt will increase LUE strength to 3+/5 to improve ability to reach items located at waist to chest height during ADLs such as meal preparation and eating.    Goal status: MET     LONG TERM GOALS: Target date: 12/29/2021     Pt will decrease pain in LUE to 3/10 or less to improve ability to sleep in the bed for 3+ consecutive hours without waking due to pain.    Goal status: MET   2.  Pt will decrease fascial restrictions in LUE to trace amounts to improve mobility required for functional reaching tasks.    Goal status: IN PROGRESS   3.  Pt will increase LUE A/ROM to [facility] to improve ability to reach for items overhead and behind back during dressing, bathing, and functional reaching tasks.    Goal status: MET   4.  Pt will increase LUE strength to 4+/5 or greater to improve ability to return to mountain biking and yardwork tasks.    Goal status: IN PROGRESS   5.  Pt will return to highest level of functioning using LUE as dominant during ADL and leisure tasks.  Baseline:  Goal status: IN PROGRESS    TODAY'S TREATMENT:   12/08/21 -P/ROM: supine, flexion, abduction, er/IR, 1x5 -Strengthening: standing, 2# weights, flexion, abduction, protraction, er/IR, horizontal abduction 10 reps each -proximal shoulder strengthening: standing- paddles, criss cross, circles each direction, 10 reps each -Ball on wall: 1' flexion, 1' abduction, green ball -X to V arms: 10 reps -Arnold press: no weight, 10 reps -Therapy ball strengthening: circles each direction, flexion, chest press, 10 reps each -Overhead lacing: seated with 1# wrist weight, lacing from top down then reversing to remove   12/03/21 -Manual: Myofascial release, soft tissue mobilization, and trigger point manual therapy completed to left upper quadrant to decrease fascial restrictions and pain and allow for increased ROM -P/ROM: supine, flexion, abduction, er/IR, 1x5 with  prolonged 10 second hold at end range.  -A/ROM: standing, flexion, abduction, protraction, er/IR, 1x10 -Wall Washing: flexion and abduction 3x20" stretches -Scapula Strengthening: green theraband, retraction, rows, and extension, 2x15 -functional reaching: using the top 2 cabinet shelves, rearranging multiple food items on both shelves, 1x3' -Shoulder endurance: overhead lacing, 1x3' -UBE: level 3, 2 mins forward, 2 mins backward  12/01/21 -Manual:  Myofascial release, soft tissue mobilization, and trigger point manual therapy completed to left upper quadrant to decrease fascial restrictions and pain and allow for increased ROM -P/ROM: supine, flexion, abduction, horizontal abduction/adduction, er/IR, 1x5 rep -A/ROM: supine,  flexion, abduction, er/IR 1x10 -isometrics: flexion, extension, adduction, er/IR, 5x5" each -tennis ball massage: focus on deltoids, biceps, and tricep 1x30" each muscle group -Wall washing: 1x10 with 10 sec hold, flexion, abduction -UBE: level 2, 3' forward, 3' backward     PATIENT EDUCATION: Education details: Strengthening-add a 1 or 2# weight to the A/ROM exercises Person educated: Patient Education method: Explanation, demonstration, and handout Education comprehension: verbalized understanding and returned demonstration   HOME EXERCISE PROGRAM Eval: Table slides 8/22: Scapular ROM 8/29: AA/ROM and A/ROM 9/5: Tennis Ball trigger point massage 9/7: Scapula strengthening (green theraband) 9/12: shoulder strengthening  ASSESSMENT:   CLINICAL IMPRESSION: A: Mini-reassessment completed this session, pt has met all STGs and 2/5 LTGs. Pt reports he has no difficulty with ADLs at home with exception of some strength and fatigue. Progressed to strengthening today, adding weights and therapy ball exercises. Also added ball on wall and overhead lacing. Verbal cuing for form and technique, pt tends to use momentum versus strength and requires cuing for straightening  elbow.   PLAN:  OT FREQUENCY: 2x/week   OT DURATION: 8 weeks   PLANNED INTERVENTIONS: self care/ADL training, therapeutic exercise, therapeutic activity, manual therapy, scar mobilization, passive range of motion, electrical stimulation, ultrasound, moist heat, cryotherapy, and patient/family education     CONSULTED AND AGREED WITH PLAN OF CARE: Patient   PLAN FOR NEXT SESSION: continue with shoulder strengthening and stability     Guadelupe Sabin, OTR/L  [phone] 12/08/2021, 3:39 PM

## 2021-12-10 ENCOUNTER — Encounter (HOSPITAL_COMMUNITY): Payer: Self-pay | Admitting: Occupational Therapy

## 2021-12-10 ENCOUNTER — Ambulatory Visit (HOSPITAL_COMMUNITY): Payer: Medicare Other | Admitting: Occupational Therapy

## 2021-12-10 DIAGNOSIS — R29898 Other symptoms and signs involving the musculoskeletal system: Secondary | ICD-10-CM

## 2021-12-10 DIAGNOSIS — M25612 Stiffness of left shoulder, not elsewhere classified: Secondary | ICD-10-CM

## 2021-12-10 DIAGNOSIS — M25512 Pain in left shoulder: Secondary | ICD-10-CM | POA: Diagnosis not present

## 2021-12-10 NOTE — Therapy (Signed)
OUTPATIENT OCCUPATIONAL THERAPY TREATMENT NOTE   Patient Name: Malik Bowen MRN: [id number] DOB:Dec 05, 1950, 71 y.o., male Today's Date: 12/10/2021      PCP: Collene Mares, PA-C REFERRING PROVIDER: Edmonia Lynch, MD   OT End of Session - 12/10/21 1602     Visit Number 11    Number of Visits 16    Date for OT Re-Evaluation 01/02/22    Authorization Type [employer] Medicare, $20 copay    Authorization Time Period no visit limit    Progress Note Due on Visit 20    OT Start Time 1514    OT Stop Time 1556    OT Time Calculation (min) 42 min    Activity Tolerance Patient tolerated treatment well    Behavior During Therapy WFL for tasks assessed/performed                   Past Medical History:  Diagnosis Date   Diabetes mellitus without complication ([place])    Hyperlipidemia    Hypertension    Past Surgical History:  Procedure Laterality Date   EYE SURGERY     LASIK     Patient Active Problem List   Diagnosis Date Noted   Weak urinary stream 04/10/2020   Benign prostatic hyperplasia with urinary obstruction 04/10/2020   Osteoarthritis of carpometacarpal ([place]) joint of thumb 12/20/2018   Diabetes mellitus ([place]) 12/20/2018   Hypertensive disorder 12/20/2018   Hypercholesterolemia 12/20/2018   Carpal tunnel syndrome of left wrist 12/20/2018   Sacroiliac joint pain 05/17/2018   Arthritis of wrist 07/19/2017    ONSET DATE: 10/15/21  REFERRING DIAG: S/P L SH Subacromial Decompression, DCE, biceps tenodesis, RCR  THERAPY DIAG:  Acute pain of left shoulder  Stiffness of left shoulder, not elsewhere classified  Other symptoms and signs involving the musculoskeletal system  Rationale for Evaluation and Treatment Rehabilitation  PERTINENT HISTORY: Pt is a 71 y/o male s/p left RCR, bicep tenodesis, and SAD, DCE on 10/15/21. Pt injured his LUE earlier this year while mountain biking. Pt presents with sling on, reports he wears most of the time but sometimes takes off and  lets his arm hang by his side. Has attempted to reach for a few items with discomfort.   PRECAUTIONS: progress as tolerated  SUBJECTIVE: S: "I weedeated for about 45 minutes this morning."   Pt has an apt 8/28  PAIN:  Are you having pain? Yes: NPRS scale: 1/10 Pain location: Left shoulder- anterior and deltoid  Pain description: Throbbing Aggravating factors: movement  Relieving factors: -  OBJECTIVE:   HAND DOMINANCE: Left   ADLs: Overall ADLs: Pt is having difficulty with dressing, reaching up and behind back, is unable to lift and carry items. Pt is sleeping in a recliner at this time. Pt is a Teaching laboratory technician, does a lot of yardwork.      FUNCTIONAL OUTCOME MEASURES: FOTO: 43/100  9/12: 70/100   UPPER EXTREMITY ROM                  Assessed supine, er/IR adducted   Passive ROM Left eval Left 12/08/21  Shoulder flexion 112 161  Shoulder abduction 134 170  Shoulder internal rotation 90 90  Shoulder external rotation 34 80  (Blank rows = not tested)       Active ROM Left eval Left 12/08/21  Shoulder flexion  151  Shoulder abduction  145  Shoulder internal rotation  90  Shoulder external rotation  67  (Blank rows = not tested)  UPPER EXTREMITY MMT:                  Not assessed due to protocol   MMT Left eval Left 12/08/21  Shoulder flexion   4-/5  Shoulder abduction   4-/5  Shoulder internal rotation   5/5  Shoulder external rotation   4-/5  (Blank rows = not tested)      GOALS: Goals reviewed with patient? Yes   SHORT TERM GOALS: Target date: 12/01/2021     Pt will be provided with and educated on HEP to improve mobility required for use of LUE during ADL completion.    Goal status: MET   2.  Pt will increase LUE P/ROM to [facility] to improve ability to perform dressing and bathing tasks with minimal compensatory strategies.    Goal status: MET   3.  Pt will increase LUE strength to 3+/5 to improve ability to reach items located at waist to chest  height during ADLs such as meal preparation and eating.    Goal status: MET     LONG TERM GOALS: Target date: 12/29/2021     Pt will decrease pain in LUE to 3/10 or less to improve ability to sleep in the bed for 3+ consecutive hours without waking due to pain.    Goal status: MET   2.  Pt will decrease fascial restrictions in LUE to trace amounts to improve mobility required for functional reaching tasks.    Goal status: IN PROGRESS   3.  Pt will increase LUE A/ROM to [facility] to improve ability to reach for items overhead and behind back during dressing, bathing, and functional reaching tasks.    Goal status: MET   4.  Pt will increase LUE strength to 4+/5 or greater to improve ability to return to mountain biking and yardwork tasks.    Goal status: IN PROGRESS   5.  Pt will return to highest level of functioning using LUE as dominant during ADL and leisure tasks.  Baseline:  Goal status: IN PROGRESS    TODAY'S TREATMENT:   12/10/21 -P/ROM: supine, flexion, abduction, er/IR, 1x5 -Sidelying A/ROM: protraction, flexion, horizontal abduction, er/IR, abduction, 10 reps each -Strengthening: standing, 2# weights, flexion, protraction, er/IR, horizontal abduction; abduction with no weight 10 reps each -Therapy ball strengthening: chest press, overhead press, flexion, circles each direction, 10 reps each -Overhead lacing: seated with 1# wrist weight, lacing from top down then reversing to remove -UBE: level 3, 3' forward 3' reverse, pace: 17.5   -Ball on wall: 1' flexion, 1' abduction, green ball   12/08/21 -P/ROM: supine, flexion, abduction, er/IR, 1x5 -Strengthening: standing, 2# weights, flexion, abduction, protraction, er/IR, horizontal abduction 10 reps each -proximal shoulder strengthening: standing- paddles, criss cross, circles each direction, 10 reps each -Ball on wall: 1' flexion, 1' abduction, green ball -X to V arms: 10 reps -Arnold press: no weight, 10 reps -Therapy  ball strengthening: circles each direction, flexion, chest press, 10 reps each -Overhead lacing: seated with 1# wrist weight, lacing from top down then reversing to remove   12/03/21 -Manual: Myofascial release, soft tissue mobilization, and trigger point manual therapy completed to left upper quadrant to decrease fascial restrictions and pain and allow for increased ROM -P/ROM: supine, flexion, abduction, er/IR, 1x5 with prolonged 10 second hold at end range.  -A/ROM: standing, flexion, abduction, protraction, er/IR, 1x10 -Wall Washing: flexion and abduction 3x20" stretches -Scapula Strengthening: green theraband, retraction, rows, and extension, 2x15 -functional reaching: using the top 2 cabinet  shelves, rearranging multiple food items on both shelves, 1x3' -Shoulder endurance: overhead lacing, 1x3' -UBE: level 3, 2 mins forward, 2 mins backward      PATIENT EDUCATION: Education details:  Person educated: Patient Education method: Explanation, demonstration, and handout Education comprehension: verbalized understanding and returned demonstration   HOME EXERCISE PROGRAM Eval: Table slides 8/22: Scapular ROM 8/29: AA/ROM and A/ROM 9/5: Tennis Ball trigger point massage 9/7: Scapula strengthening (green theraband) 9/12: shoulder strengthening  ASSESSMENT:   CLINICAL IMPRESSION: A: Pt reports completing yardwork and using LUE for weedeating and push mowing. Continued with strengthening today, passive stretching completed first with pt achieving full ROM. Added sidelying strengthening using 2# weights, continued with standing strengthening. Focusing on form and technique today, working to keep elbow as straight as possible. Rest breaks provided as needed today, continued with overhead lacing and ball on wall. Verbal cuing for form and technique.   PLAN:  OT FREQUENCY: 2x/week   OT DURATION: 8 weeks   PLANNED INTERVENTIONS: self care/ADL training, therapeutic exercise,  therapeutic activity, manual therapy, scar mobilization, passive range of motion, electrical stimulation, ultrasound, moist heat, cryotherapy, and patient/family education     CONSULTED AND AGREED WITH PLAN OF CARE: Patient   PLAN FOR NEXT SESSION: continue with shoulder strengthening and stability     Guadelupe Sabin, OTR/L  [phone] 12/10/2021, 4:03 PM

## 2021-12-14 ENCOUNTER — Ambulatory Visit (HOSPITAL_COMMUNITY): Payer: Medicare Other | Admitting: Occupational Therapy

## 2021-12-14 ENCOUNTER — Encounter (HOSPITAL_COMMUNITY): Payer: Self-pay | Admitting: Occupational Therapy

## 2021-12-14 DIAGNOSIS — M25512 Pain in left shoulder: Secondary | ICD-10-CM

## 2021-12-14 DIAGNOSIS — M25612 Stiffness of left shoulder, not elsewhere classified: Secondary | ICD-10-CM

## 2021-12-14 DIAGNOSIS — R29898 Other symptoms and signs involving the musculoskeletal system: Secondary | ICD-10-CM | POA: Diagnosis not present

## 2021-12-14 NOTE — Therapy (Signed)
OUTPATIENT OCCUPATIONAL THERAPY TREATMENT NOTE   Patient Name: Malik Bowen MRN: [id number] DOB:Aug 07, 1950, 71 y.o., male Today's Date: 12/14/2021      PCP: Collene Mares, PA-C REFERRING PROVIDER: Edmonia Lynch, MD   OT End of Session - 12/14/21 1025     Visit Number 12    Number of Visits 16    Date for OT Re-Evaluation 01/02/22    Authorization Type [employer] Medicare, $20 copay    Authorization Time Period no visit limit    Progress Note Due on Visit 20    OT Start Time 0947    OT Stop Time 1028    OT Time Calculation (min) 41 min    Activity Tolerance Patient tolerated treatment well    Behavior During Therapy [facility] for tasks assessed/performed                    Past Medical History:  Diagnosis Date   Diabetes mellitus without complication ([place])    Hyperlipidemia    Hypertension    Past Surgical History:  Procedure Laterality Date   EYE SURGERY     LASIK     Patient Active Problem List   Diagnosis Date Noted   Weak urinary stream 04/10/2020   Benign prostatic hyperplasia with urinary obstruction 04/10/2020   Osteoarthritis of carpometacarpal ([place]) joint of thumb 12/20/2018   Diabetes mellitus ([place]) 12/20/2018   Hypertensive disorder 12/20/2018   Hypercholesterolemia 12/20/2018   Carpal tunnel syndrome of left wrist 12/20/2018   Sacroiliac joint pain 05/17/2018   Arthritis of wrist 07/19/2017    ONSET DATE: 10/15/21  REFERRING DIAG: S/P L SH Subacromial Decompression, DCE, biceps tenodesis, RCR  THERAPY DIAG:  Acute pain of left shoulder  Stiffness of left shoulder, not elsewhere classified  Other symptoms and signs involving the musculoskeletal system  Rationale for Evaluation and Treatment Rehabilitation  PERTINENT HISTORY: Pt is a 71 y/o male s/p left RCR, bicep tenodesis, and SAD, DCE on 10/15/21. Pt injured his LUE earlier this year while mountain biking. Pt presents with sling on, reports he wears most of the time but sometimes takes off and  lets his arm hang by his side. Has attempted to reach for a few items with discomfort.   PRECAUTIONS: progress as tolerated  SUBJECTIVE: S: "I weedeated for about 45 minutes this morning."   Pt has an apt 8/28  PAIN:  Are you having pain? Yes: NPRS scale: 1/10 Pain location: Left shoulder- anterior and deltoid  Pain description: Throbbing Aggravating factors: movement  Relieving factors: -  OBJECTIVE:   HAND DOMINANCE: Left   ADLs: Overall ADLs: Pt is having difficulty with dressing, reaching up and behind back, is unable to lift and carry items. Pt is sleeping in a recliner at this time. Pt is a Teaching laboratory technician, does a lot of yardwork.      FUNCTIONAL OUTCOME MEASURES: FOTO: 43/100  9/12: 70/100   UPPER EXTREMITY ROM                  Assessed supine, er/IR adducted   Passive ROM Left eval Left 12/08/21  Shoulder flexion 112 161  Shoulder abduction 134 170  Shoulder internal rotation 90 90  Shoulder external rotation 34 80  (Blank rows = not tested)       Active ROM Left eval Left 12/08/21  Shoulder flexion  151  Shoulder abduction  145  Shoulder internal rotation  90  Shoulder external rotation  67  (Blank rows = not tested)  UPPER EXTREMITY MMT:                  Not assessed due to protocol   MMT Left eval Left 12/08/21  Shoulder flexion   4-/5  Shoulder abduction   4-/5  Shoulder internal rotation   5/5  Shoulder external rotation   4-/5  (Blank rows = not tested)      GOALS: Goals reviewed with patient? Yes   SHORT TERM GOALS: Target date: 12/01/2021     Pt will be provided with and educated on HEP to improve mobility required for use of LUE during ADL completion.    Goal status: MET   2.  Pt will increase LUE P/ROM to [facility] to improve ability to perform dressing and bathing tasks with minimal compensatory strategies.    Goal status: MET   3.  Pt will increase LUE strength to 3+/5 to improve ability to reach items located at waist to chest  height during ADLs such as meal preparation and eating.    Goal status: MET     LONG TERM GOALS: Target date: 12/29/2021     Pt will decrease pain in LUE to 3/10 or less to improve ability to sleep in the bed for 3+ consecutive hours without waking due to pain.    Goal status: MET   2.  Pt will decrease fascial restrictions in LUE to trace amounts to improve mobility required for functional reaching tasks.    Goal status: IN PROGRESS   3.  Pt will increase LUE A/ROM to [facility] to improve ability to reach for items overhead and behind back during dressing, bathing, and functional reaching tasks.    Goal status: MET   4.  Pt will increase LUE strength to 4+/5 or greater to improve ability to return to mountain biking and yardwork tasks.    Goal status: IN PROGRESS   5.  Pt will return to highest level of functioning using LUE as dominant during ADL and leisure tasks.  Baseline:  Goal status: IN PROGRESS    TODAY'S TREATMENT:   12/14/21 -P/ROM: supine, flexion, abduction, er/IR, 1x5 -Sidelying strengthening: 2#, protraction, flexion, horizontal abduction, er/IR, abduction, 12 reps each -shoulder stretches: IR with towel behind back vertically, 2x10" -ABCs: 2#, arm at 90 degrees flexion -Therapy ball strengthening: chest press, overhead press, flexion, circles each direction, 12 reps each -Strengthening: standing, 2# weights, flexion, protraction, er/IR, horizontal abduction; abduction with no weight 12 reps each -X to V arms, arnold press: 10 reps each -Overhead lacing: seated with 2# wrist weight, lacing from top down then reversing to remove -UBE: level 3, 3' forward 3' reverse, pace: 14.5  12/10/21 -P/ROM: supine, flexion, abduction, er/IR, 1x5 -Sidelying A/ROM: protraction, flexion, horizontal abduction, er/IR, abduction, 10 reps each -Strengthening: standing, 2# weights, flexion, protraction, er/IR, horizontal abduction; abduction with no weight 10 reps each -Therapy ball  strengthening: chest press, overhead press, flexion, circles each direction, 10 reps each -Overhead lacing: seated with 1# wrist weight, lacing from top down then reversing to remove -UBE: level 3, 3' forward 3' reverse, pace: 17.5   -Ball on wall: 1' flexion, 1' abduction, green ball   12/08/21 -P/ROM: supine, flexion, abduction, er/IR, 1x5 -Strengthening: standing, 2# weights, flexion, abduction, protraction, er/IR, horizontal abduction 10 reps each -proximal shoulder strengthening: standing- paddles, criss cross, circles each direction, 10 reps each -Ball on wall: 1' flexion, 1' abduction, green ball -X to V arms: 10 reps -Arnold press: no weight, 10 reps -Therapy ball strengthening:  circles each direction, flexion, chest press, 10 reps each -Overhead lacing: seated with 1# wrist weight, lacing from top down then reversing to remove     PATIENT EDUCATION: Education details:  Person educated: Patient Education method: Consulting civil engineer, demonstration, and handout Education comprehension: verbalized understanding and returned demonstration   HOME EXERCISE PROGRAM Eval: Table slides 8/22: Scapular ROM 8/29: AA/ROM and A/ROM 9/5: Tennis Ball trigger point massage 9/7: Scapula strengthening (green theraband) 9/12: shoulder strengthening  ASSESSMENT:   CLINICAL IMPRESSION: A: Pt reports he is sore from sleeping in the bed as he is a side sleeper and rotates during the night. Continued with passive stretching-pt with full ROM in all planes. Added 2# weight to sidelying exercises, added IR stretch with towel. Continued with strengthening today, added ABCs proximal shoulder strengthening, arnold press, and increased wrist weight to 2# during overhead lacing. Pt with mod fatigue during exercises, rest breaks provided as needed. Verbal cuing for form and technique, focusing on steady pace versus speeding through exercises.   PLAN:  OT FREQUENCY: 2x/week   OT DURATION: 8 weeks   PLANNED  INTERVENTIONS: self care/ADL training, therapeutic exercise, therapeutic activity, manual therapy, scar mobilization, passive range of motion, electrical stimulation, ultrasound, moist heat, cryotherapy, and patient/family education     CONSULTED AND AGREED WITH PLAN OF CARE: Patient   PLAN FOR NEXT SESSION: continue with shoulder strengthening and stability     Guadelupe Sabin, OTR/L  [phone] 12/14/2021, 10:29 AM

## 2021-12-18 ENCOUNTER — Ambulatory Visit (HOSPITAL_COMMUNITY): Payer: Medicare Other | Admitting: Occupational Therapy

## 2021-12-18 ENCOUNTER — Encounter (HOSPITAL_COMMUNITY): Payer: Self-pay | Admitting: Occupational Therapy

## 2021-12-18 DIAGNOSIS — M9902 Segmental and somatic dysfunction of thoracic region: Secondary | ICD-10-CM | POA: Diagnosis not present

## 2021-12-18 DIAGNOSIS — M25512 Pain in left shoulder: Secondary | ICD-10-CM | POA: Diagnosis not present

## 2021-12-18 DIAGNOSIS — M25612 Stiffness of left shoulder, not elsewhere classified: Secondary | ICD-10-CM | POA: Diagnosis not present

## 2021-12-18 DIAGNOSIS — M5442 Lumbago with sciatica, left side: Secondary | ICD-10-CM | POA: Diagnosis not present

## 2021-12-18 DIAGNOSIS — M9903 Segmental and somatic dysfunction of lumbar region: Secondary | ICD-10-CM | POA: Diagnosis not present

## 2021-12-18 DIAGNOSIS — M546 Pain in thoracic spine: Secondary | ICD-10-CM | POA: Diagnosis not present

## 2021-12-18 DIAGNOSIS — M9905 Segmental and somatic dysfunction of pelvic region: Secondary | ICD-10-CM | POA: Diagnosis not present

## 2021-12-18 DIAGNOSIS — R29898 Other symptoms and signs involving the musculoskeletal system: Secondary | ICD-10-CM

## 2021-12-18 NOTE — Therapy (Signed)
OUTPATIENT OCCUPATIONAL THERAPY TREATMENT NOTE AND DISCHARGE   Patient Name: Malik Bowen MRN: [id number] DOB:1950-10-06, 71 y.o., male Today's Date: 12/18/2021    PCP: Collene Mares, PA-C REFERRING PROVIDER: Edmonia Lynch, MD  OCCUPATIONAL THERAPY DISCHARGE SUMMARY  Visits from Start of Care: 13  Current functional level related to goals / functional outcomes: Patient has met all goals pertaining to functional ROM, as well as minimal fascial restrictions, and ability to complete all daily tasks with no compensatory strategies independently. Pt has also partially met his strength goal, which he will continue to work towards with his comprehensive HEP.    Remaining deficits: Pt continues to present with mild weakness for shoulder flexion and abduction (4/5, instead of 4+/5). Anticipate with continued use of completion of exercises pt will meet these goals outside of therapy.    Education / Equipment: Pt was provided a comprehensive HEP with multiple levels of theraband's to continue progressing his strength.    Plan: Patient agrees to discharge. Patient is being discharged due to meeting the stated rehab goals.        OT End of Session - 12/18/21 1430     Visit Number 13    Number of Visits 16    Date for OT Re-Evaluation 01/02/22    Authorization Type [employer] Medicare, $20 copay    Authorization Time Period no visit limit    Progress Note Due on Visit 24    OT Start Time 1115    OT Stop Time 1200    OT Time Calculation (min) 45 min    Activity Tolerance Patient tolerated treatment well    Behavior During Therapy WFL for tasks assessed/performed                 Past Medical History:  Diagnosis Date   Diabetes mellitus without complication ([place])    Hyperlipidemia    Hypertension    Past Surgical History:  Procedure Laterality Date   EYE SURGERY     LASIK     Patient Active Problem List   Diagnosis Date Noted   Weak urinary stream 04/10/2020   Benign  prostatic hyperplasia with urinary obstruction 04/10/2020   Osteoarthritis of carpometacarpal (CMC) joint of thumb 12/20/2018   Diabetes mellitus ([place]) 12/20/2018   Hypertensive disorder 12/20/2018   Hypercholesterolemia 12/20/2018   Carpal tunnel syndrome of left wrist 12/20/2018   Sacroiliac joint pain 05/17/2018   Arthritis of wrist 07/19/2017    ONSET DATE: 10/15/21  REFERRING DIAG: S/P L SH Subacromial Decompression, DCE, biceps tenodesis, RCR  THERAPY DIAG:  Acute pain of left shoulder  Stiffness of left shoulder, not elsewhere classified  Other symptoms and signs involving the musculoskeletal system  Rationale for Evaluation and Treatment Rehabilitation  PERTINENT HISTORY: Pt is a 71 y/o male s/p left RCR, bicep tenodesis, and SAD, DCE on 10/15/21. Pt injured his LUE earlier this year while mountain biking. Pt presents with sling on, reports he wears most of the time but sometimes takes off and lets his arm hang by his side. Has attempted to reach for a few items with discomfort.   PRECAUTIONS: progress as tolerated  SUBJECTIVE: S: "I was helping my son cut down trees and limbs yesterday and it felt good to move."    PAIN:  Are you having pain? Yes: NPRS scale: 1/10 Pain location: Left shoulder- anterior and deltoid  Pain description: Tight Aggravating factors: movement  Relieving factors: -  OBJECTIVE:   HAND DOMINANCE: Left   ADLs: Overall ADLs: Pt  is having difficulty with dressing, reaching up and behind back, is unable to lift and carry items. Pt is sleeping in a recliner at this time. Pt is a Teaching laboratory technician, does a lot of yardwork.      FUNCTIONAL OUTCOME MEASURES: FOTO: 43/100  9/12: 70/100 9/22: 75/100   UPPER EXTREMITY ROM                  Assessed supine, er/IR adducted   Passive ROM Left eval Left 12/08/21  Shoulder flexion 112 161  Shoulder abduction 134 170  Shoulder internal rotation 90 90  Shoulder external rotation 34 80  (Blank rows =  not tested)       Active ROM Left eval Left 12/08/21 Left 12/18/21  Shoulder flexion  151 157  Shoulder abduction  145 163  Shoulder internal rotation  90 90  Shoulder external rotation  67 68  (Blank rows = not tested)   UPPER EXTREMITY MMT:                  Not assessed due to protocol   MMT Left eval Left 12/08/21 Left 12/18/21  Shoulder flexion   4-/5 4/5  Shoulder abduction   4-/5 4/5  Shoulder internal rotation   5/5 5/5  Shoulder external rotation   4-/5 4+5  (Blank rows = not tested)      GOALS: Goals reviewed with patient? Yes   SHORT TERM GOALS: Target date: 12/01/2021     Pt will be provided with and educated on HEP to improve mobility required for use of LUE during ADL completion.    Goal status: MET   2.  Pt will increase LUE P/ROM to [facility] to improve ability to perform dressing and bathing tasks with minimal compensatory strategies.    Goal status: MET   3.  Pt will increase LUE strength to 3+/5 to improve ability to reach items located at waist to chest height during ADLs such as meal preparation and eating.    Goal status: MET     LONG TERM GOALS: Target date: 12/29/2021     Pt will decrease pain in LUE to 3/10 or less to improve ability to sleep in the bed for 3+ consecutive hours without waking due to pain.    Goal status: MET   2.  Pt will decrease fascial restrictions in LUE to trace amounts to improve mobility required for functional reaching tasks.    Goal status: MET   3.  Pt will increase LUE A/ROM to [facility] to improve ability to reach for items overhead and behind back during dressing, bathing, and functional reaching tasks.    Goal status: MET   4.  Pt will increase LUE strength to 4+/5 or greater to improve ability to return to mountain biking and yardwork tasks.    Goal status: PARTIALLY MET   5.  Pt will return to highest level of functioning using LUE as dominant during ADL and leisure tasks.   Goal status: MET    TODAY'S  TREATMENT:  12/18/21 -Manual Therapy: myofascial release and trigger point to address fascial restrictions and muscle tightness in order to improve ROM and decrease pain.  -A/ROM: flexion, abduction, protraction, er/IR, 1x10 -Scapular strengthening: retraction, protraction, extension, rows, blue band, 1x15 -Shoulder strengthening: flexion, abduction, er/IR, horizontal abduction, green band, 1x10 -Stretching: pectoralis stretch, bicep stretch, er/IR stretch, 3x20"  12/14/21 -P/ROM: supine, flexion, abduction, er/IR, 1x5 -Sidelying strengthening: 2#, protraction, flexion, horizontal abduction, er/IR, abduction, 12 reps each -shoulder  stretches: IR with towel behind back vertically, 2x10" -ABCs: 2#, arm at 90 degrees flexion -Therapy ball strengthening: chest press, overhead press, flexion, circles each direction, 12 reps each -Strengthening: standing, 2# weights, flexion, protraction, er/IR, horizontal abduction; abduction with no weight 12 reps each -X to V arms, arnold press: 10 reps each -Overhead lacing: seated with 2# wrist weight, lacing from top down then reversing to remove -UBE: level 3, 3' forward 3' reverse, pace: 14.5  12/10/21 -P/ROM: supine, flexion, abduction, er/IR, 1x5 -Sidelying A/ROM: protraction, flexion, horizontal abduction, er/IR, abduction, 10 reps each -Strengthening: standing, 2# weights, flexion, protraction, er/IR, horizontal abduction; abduction with no weight 10 reps each -Therapy ball strengthening: chest press, overhead press, flexion, circles each direction, 10 reps each -Overhead lacing: seated with 1# wrist weight, lacing from top down then reversing to remove -UBE: level 3, 3' forward 3' reverse, pace: 17.5   -Ball on wall: 1' flexion, 1' abduction, green ball   PATIENT EDUCATION: Education details: Reviewed HEP Person educated: Patient Education method: Explanation, demonstration, and handout Education comprehension: verbalized understanding and  returned demonstration   HOME EXERCISE PROGRAM Eval: Table slides 8/22: Scapular ROM 8/29: AA/ROM and A/ROM 9/5: Tennis Ball trigger point massage 9/7: Scapula strengthening (green theraband) 9/12: shoulder strengthening  ASSESSMENT:   CLINICAL IMPRESSION: A: Pt reporting increased pain and tenderness in his biceps since working outside yesterday. Pt had moderate fascial restrictions along the bicep and mild restrictions noted along the lateral border of the scapula, all addressed with manual therapy. Session focused on reviewing HEP, increasing theraband resistance for scapula exercises, and completing reassessment for discharge. Therapist provided minimal cuing for proper mechanics and to slow down throughout exercises for safe completion of tasks. Pt has met all goals, except for strength, which he as partially met and will continue to improve upon with the comprehensive HEP.   PLAN:  OT FREQUENCY: Discharge    Paulita Fujita, OTR/L [phone] 12/18/2021, 2:31 PM

## 2021-12-18 NOTE — Patient Instructions (Signed)

## 2021-12-22 ENCOUNTER — Encounter (HOSPITAL_COMMUNITY): Payer: Medicare Other | Admitting: Occupational Therapy

## 2021-12-24 ENCOUNTER — Encounter (HOSPITAL_COMMUNITY): Payer: Medicare Other | Admitting: Occupational Therapy

## 2021-12-29 ENCOUNTER — Encounter (HOSPITAL_COMMUNITY): Payer: Medicare Other | Admitting: Occupational Therapy

## 2021-12-31 ENCOUNTER — Encounter (HOSPITAL_COMMUNITY): Payer: Medicare Other | Admitting: Occupational Therapy

## 2022-01-04 DIAGNOSIS — M461 Sacroiliitis, not elsewhere classified: Secondary | ICD-10-CM | POA: Diagnosis not present

## 2022-01-06 DIAGNOSIS — E782 Mixed hyperlipidemia: Secondary | ICD-10-CM | POA: Diagnosis not present

## 2022-01-06 DIAGNOSIS — E119 Type 2 diabetes mellitus without complications: Secondary | ICD-10-CM | POA: Diagnosis not present

## 2022-01-06 DIAGNOSIS — G4733 Obstructive sleep apnea (adult) (pediatric): Secondary | ICD-10-CM | POA: Diagnosis not present

## 2022-01-06 DIAGNOSIS — I10 Essential (primary) hypertension: Secondary | ICD-10-CM | POA: Diagnosis not present

## 2022-01-20 ENCOUNTER — Other Ambulatory Visit: Payer: Self-pay | Admitting: Internal Medicine

## 2022-01-25 DIAGNOSIS — M19012 Primary osteoarthritis, left shoulder: Secondary | ICD-10-CM | POA: Diagnosis not present

## 2022-01-29 DIAGNOSIS — Z23 Encounter for immunization: Secondary | ICD-10-CM | POA: Diagnosis not present

## 2022-02-22 DIAGNOSIS — M5442 Lumbago with sciatica, left side: Secondary | ICD-10-CM | POA: Diagnosis not present

## 2022-02-22 DIAGNOSIS — M9903 Segmental and somatic dysfunction of lumbar region: Secondary | ICD-10-CM | POA: Diagnosis not present

## 2022-02-22 DIAGNOSIS — M546 Pain in thoracic spine: Secondary | ICD-10-CM | POA: Diagnosis not present

## 2022-02-22 DIAGNOSIS — M9902 Segmental and somatic dysfunction of thoracic region: Secondary | ICD-10-CM | POA: Diagnosis not present

## 2022-02-22 DIAGNOSIS — M9905 Segmental and somatic dysfunction of pelvic region: Secondary | ICD-10-CM | POA: Diagnosis not present

## 2022-03-01 DIAGNOSIS — L821 Other seborrheic keratosis: Secondary | ICD-10-CM | POA: Diagnosis not present

## 2022-03-01 DIAGNOSIS — D1801 Hemangioma of skin and subcutaneous tissue: Secondary | ICD-10-CM | POA: Diagnosis not present

## 2022-03-01 DIAGNOSIS — L814 Other melanin hyperpigmentation: Secondary | ICD-10-CM | POA: Diagnosis not present

## 2022-03-01 DIAGNOSIS — D692 Other nonthrombocytopenic purpura: Secondary | ICD-10-CM | POA: Diagnosis not present

## 2022-03-10 DIAGNOSIS — M47816 Spondylosis without myelopathy or radiculopathy, lumbar region: Secondary | ICD-10-CM | POA: Diagnosis not present

## 2022-03-11 ENCOUNTER — Other Ambulatory Visit: Payer: Self-pay | Admitting: Physician Assistant

## 2022-03-11 DIAGNOSIS — M47816 Spondylosis without myelopathy or radiculopathy, lumbar region: Secondary | ICD-10-CM

## 2022-03-12 DIAGNOSIS — M9905 Segmental and somatic dysfunction of pelvic region: Secondary | ICD-10-CM | POA: Diagnosis not present

## 2022-03-12 DIAGNOSIS — M546 Pain in thoracic spine: Secondary | ICD-10-CM | POA: Diagnosis not present

## 2022-03-12 DIAGNOSIS — M9903 Segmental and somatic dysfunction of lumbar region: Secondary | ICD-10-CM | POA: Diagnosis not present

## 2022-03-12 DIAGNOSIS — M9902 Segmental and somatic dysfunction of thoracic region: Secondary | ICD-10-CM | POA: Diagnosis not present

## 2022-03-12 DIAGNOSIS — M5442 Lumbago with sciatica, left side: Secondary | ICD-10-CM | POA: Diagnosis not present

## 2022-03-31 DIAGNOSIS — Z7984 Long term (current) use of oral hypoglycemic drugs: Secondary | ICD-10-CM | POA: Diagnosis not present

## 2022-03-31 DIAGNOSIS — E119 Type 2 diabetes mellitus without complications: Secondary | ICD-10-CM | POA: Diagnosis not present

## 2022-03-31 DIAGNOSIS — H2513 Age-related nuclear cataract, bilateral: Secondary | ICD-10-CM | POA: Diagnosis not present

## 2022-04-12 DIAGNOSIS — M9903 Segmental and somatic dysfunction of lumbar region: Secondary | ICD-10-CM | POA: Diagnosis not present

## 2022-04-12 DIAGNOSIS — M9905 Segmental and somatic dysfunction of pelvic region: Secondary | ICD-10-CM | POA: Diagnosis not present

## 2022-04-12 DIAGNOSIS — M5441 Lumbago with sciatica, right side: Secondary | ICD-10-CM | POA: Diagnosis not present

## 2022-04-12 DIAGNOSIS — M9902 Segmental and somatic dysfunction of thoracic region: Secondary | ICD-10-CM | POA: Diagnosis not present

## 2022-04-13 ENCOUNTER — Ambulatory Visit
Admission: RE | Admit: 2022-04-13 | Discharge: 2022-04-13 | Disposition: A | Discharge status: Home / Self Care | Payer: Medicare Other | Source: Ambulatory Visit | Attending: Physician Assistant | Admitting: Physician Assistant

## 2022-04-13 DIAGNOSIS — M545 Low back pain, unspecified: Secondary | ICD-10-CM | POA: Diagnosis not present

## 2022-04-13 DIAGNOSIS — M48061 Spinal stenosis, lumbar region without neurogenic claudication: Secondary | ICD-10-CM | POA: Diagnosis not present

## 2022-04-13 DIAGNOSIS — M47816 Spondylosis without myelopathy or radiculopathy, lumbar region: Secondary | ICD-10-CM

## 2022-04-15 DIAGNOSIS — M4316 Spondylolisthesis, lumbar region: Secondary | ICD-10-CM | POA: Diagnosis not present

## 2022-04-28 DIAGNOSIS — M9903 Segmental and somatic dysfunction of lumbar region: Secondary | ICD-10-CM | POA: Diagnosis not present

## 2022-04-28 DIAGNOSIS — M9905 Segmental and somatic dysfunction of pelvic region: Secondary | ICD-10-CM | POA: Diagnosis not present

## 2022-04-28 DIAGNOSIS — M5441 Lumbago with sciatica, right side: Secondary | ICD-10-CM | POA: Diagnosis not present

## 2022-04-28 DIAGNOSIS — M9902 Segmental and somatic dysfunction of thoracic region: Secondary | ICD-10-CM | POA: Diagnosis not present

## 2022-05-12 ENCOUNTER — Other Ambulatory Visit: Payer: Self-pay | Admitting: Neurological Surgery

## 2022-05-18 NOTE — Pre-Procedure Instructions (Signed)
Surgical Instructions    Your procedure is scheduled on May 25, 2022.  Report to [facility] Main Entrance "A" at 05:30 A.M., then check in with the Admitting office.  Call this number if you have problems the morning of surgery:  [phone]   If you have any questions prior to your surgery date call [phone]: Open Monday-Friday 8am-4pm If you experience any cold or flu symptoms such as cough, fever, chills, shortness of breath, etc. between now and your scheduled surgery, please notify us at the above number     Remember:  Do not eat after midnight the night before your surgery  You may drink clear liquids until 04:30am the morning of your surgery.   Clear liquids allowed are: Water, Non-Citrus Juices (without pulp), Carbonated Beverages, Clear Tea, Black Coffee ONLY (NO MILK, CREAM OR POWDERED CREAMER of any kind), and Gatorade    Take these medicines the morning of surgery with A SIP OF WATER:  NONE   As of today, STOP taking any Aspirin (unless otherwise instructed by your surgeon) Aleve, Naproxen, Ibuprofen, Motrin, Advil, Goody's, BC's, all herbal medications, fish oil, and all vitamins. This includes your Celecoxib (Celebrex).  WHAT DO I DO ABOUT MY DIABETES MEDICATION?   Do not take oral diabetes medicines (pills) the morning of surgery. DO NOT take your metformin (Glucophage) the morning of surgery.  STOP your Tirzepatide Darcel Bayley) 7 days prior to surgery.    HOW TO MANAGE YOUR DIABETES BEFORE AND AFTER SURGERY  Why is it important to control my blood sugar before and after surgery? Improving blood sugar levels before and after surgery helps healing and can limit problems. A way of improving blood sugar control is eating a healthy diet by:  Eating less sugar and carbohydrates  Increasing activity/exercise  Talking with your doctor about reaching your blood sugar goals High blood sugars (greater than 180 mg/dL) can raise your risk of infections and slow  your recovery, so you will need to focus on controlling your diabetes during the weeks before surgery. Make sure that the doctor who takes care of your diabetes knows about your planned surgery including the date and location.  How do I manage my blood sugar before surgery? Check your blood sugar at least 4 times a day, starting 2 days before surgery, to make sure that the level is not too high or low.  Check your blood sugar the morning of your surgery when you wake up and every 2 hours until you get to the Short Stay unit.  If your blood sugar is less than 70 mg/dL, you will need to treat for low blood sugar: Do not take insulin. Treat a low blood sugar (less than 70 mg/dL) with  cup of clear juice (cranberry or apple), 4 glucose tablets, OR glucose gel. Recheck blood sugar in 15 minutes after treatment (to make sure it is greater than 70 mg/dL). If your blood sugar is not greater than 70 mg/dL on recheck, call [phone] for further instructions. Report your blood sugar to the short stay nurse when you get to Short Stay.  If you are admitted to the hospital after surgery: Your blood sugar will be checked by the staff and you will probably be given insulin after surgery (instead of oral diabetes medicines) to make sure you have good blood sugar levels. The goal for blood sugar control after surgery is 80-180 mg/dL.            Do not wear jewelry. Do not  wear lotions, powders, cologne or deodorant. Do not shave 48 hours prior to surgery.  Men may shave face and neck. Do not bring valuables to the hospital.  [facility] is not responsible for any belongings or valuables.    Do NOT Smoke (Tobacco/Vaping)  24 hours prior to your procedure  If you use a CPAP at night, you may bring your mask for your overnight stay.   Contacts, glasses, hearing aids, dentures or partials may not be worn into surgery, please bring cases for these belongings   For patients admitted to the hospital,  discharge time will be determined by your treatment team.   Patients discharged the day of surgery will not be allowed to drive home, and someone needs to stay with them for 24 hours.   SURGICAL WAITING ROOM VISITATION Patients having surgery or a procedure may have no more than 2 support people in the waiting area - these visitors may rotate.   Children under the age of 19 must have an adult with them who is not the patient. If the patient needs to stay at the hospital during part of their recovery, the visitor guidelines for inpatient rooms apply. Pre-op nurse will coordinate an appropriate time for 1 support person to accompany patient in pre-op.  This support person may not rotate.   Please refer to [email or web address] for the visitor guidelines for Inpatients (after your surgery is over and you are in a regular room).    Special instructions:    Oral Hygiene is also important to reduce your risk of infection.  Remember - BRUSH YOUR TEETH THE MORNING OF SURGERY WITH YOUR REGULAR TOOTHPASTE   [place]- Preparing For Surgery  Before surgery, you can play an important role. Because skin is not sterile, your skin needs to be as free of germs as possible. You can reduce the number of germs on your skin by washing with CHG (chlorahexidine gluconate) Soap before surgery.  CHG is an antiseptic cleaner which kills germs and bonds with the skin to continue killing germs even after washing.     Please do not use if you have an allergy to CHG or antibacterial soaps. If your skin becomes reddened/irritated stop using the CHG.  Do not shave (including legs and underarms) for at least 48 hours prior to first CHG shower. It is OK to shave your face.  Please follow these instructions carefully.     Shower the NIGHT BEFORE SURGERY and the MORNING OF SURGERY with CHG Soap.   If you chose to wash your hair, wash your hair first as usual with your  normal shampoo. After you shampoo, rinse your hair and body thoroughly to remove the shampoo.  Then [employer] and genitals (private parts) with your normal soap and rinse thoroughly to remove soap.  After that Use CHG Soap as you would any other liquid soap. You can apply CHG directly to the skin and wash gently with a scrungie or a clean washcloth.   Apply the CHG Soap to your body ONLY FROM THE NECK DOWN.  Do not use on open wounds or open sores. Avoid contact with your eyes, ears, mouth and genitals (private parts). Wash Face and genitals (private parts)  with your normal soap.   Wash thoroughly, paying special attention to the area where your surgery will be performed.  Thoroughly rinse your body with warm water from the neck down.  DO NOT shower/wash with your normal soap after using and rinsing off the  CHG Soap.  Pat yourself dry with a CLEAN TOWEL.  Wear CLEAN PAJAMAS to bed the night before surgery  Place CLEAN SHEETS on your bed the night before your surgery  DO NOT SLEEP WITH PETS.   Day of Surgery:  Take a shower with CHG soap. Wear Clean/Comfortable clothing the morning of surgery Do not apply any deodorants/lotions.   Remember to brush your teeth WITH YOUR REGULAR TOOTHPASTE.    If you received a COVID test during your pre-op visit, it is requested that you wear a mask when out in public, stay away from anyone that may not be feeling well, and notify your surgeon if you develop symptoms. If you have been in contact with anyone that has tested positive in the last 10 days, please notify your surgeon.    Please read over the following fact sheets that you were given.

## 2022-05-19 ENCOUNTER — Other Ambulatory Visit: Payer: Self-pay

## 2022-05-19 ENCOUNTER — Encounter (HOSPITAL_COMMUNITY)
Admission: RE | Admit: 2022-05-19 | Discharge: 2022-05-19 | Disposition: A | Discharge status: Home / Self Care | Payer: Medicare Other | Source: Ambulatory Visit | Attending: Neurological Surgery | Admitting: Neurological Surgery

## 2022-05-19 ENCOUNTER — Encounter (HOSPITAL_COMMUNITY): Payer: Self-pay

## 2022-05-19 VITALS — BP 124/75 | HR 62 | Temp 97.6°F | Resp 17 | Ht 71.5 in | Wt 231.0 lb

## 2022-05-19 DIAGNOSIS — Z01818 Encounter for other preprocedural examination: Secondary | ICD-10-CM | POA: Diagnosis not present

## 2022-05-19 DIAGNOSIS — I10 Essential (primary) hypertension: Secondary | ICD-10-CM | POA: Insufficient documentation

## 2022-05-19 DIAGNOSIS — E119 Type 2 diabetes mellitus without complications: Secondary | ICD-10-CM | POA: Insufficient documentation

## 2022-05-19 HISTORY — DX: Nausea with vomiting, unspecified: R11.2

## 2022-05-19 HISTORY — DX: Other complications of anesthesia, initial encounter: T88.59XA

## 2022-05-19 HISTORY — DX: Other specified postprocedural states: Z98.890

## 2022-05-19 LAB — CBC
HCT: 44.5 % (ref 39.0–52.0)
Hemoglobin: 14.7 g/dL (ref 13.0–17.0)
MCH: 28.9 pg (ref 26.0–34.0)
MCHC: 33 g/dL (ref 30.0–36.0)
MCV: 87.6 fL (ref 80.0–100.0)
Platelets: 223 10*3/uL (ref 150–400)
RBC: 5.08 MIL/uL (ref 4.22–5.81)
RDW: 13.2 % (ref 11.5–15.5)
WBC: 6.2 10*3/uL (ref 4.0–10.5)
nRBC: 0 % (ref 0.0–0.2)

## 2022-05-19 LAB — TYPE AND SCREEN
ABO/RH(D): O POS
Antibody Screen: NEGATIVE

## 2022-05-19 LAB — HEMOGLOBIN A1C
Hgb A1c MFr Bld: 5.3 % (ref 4.8–5.6)
Mean Plasma Glucose: 105.41 mg/dL

## 2022-05-19 LAB — BASIC METABOLIC PANEL
Anion gap: 8 (ref 5–15)
BUN: 24 mg/dL — ABNORMAL HIGH (ref 8–23)
CO2: 25 mmol/L (ref 22–32)
Calcium: 9.8 mg/dL (ref 8.9–10.3)
Chloride: 103 mmol/L (ref 98–111)
Creatinine, Ser: 1.06 mg/dL (ref 0.61–1.24)
GFR, Estimated: >60 mL/min (ref >60)
Glucose, Bld: 112 mg/dL — ABNORMAL HIGH (ref 70–99)
Potassium: 4.1 mmol/L (ref 3.5–5.1)
Sodium: 136 mmol/L (ref 135–145)

## 2022-05-19 LAB — SURGICAL PCR SCREEN
MRSA, PCR: NEGATIVE
Staphylococcus aureus: NEGATIVE

## 2022-05-19 LAB — GLUCOSE, CAPILLARY: Glucose-Capillary: 107 mg/dL — ABNORMAL HIGH (ref 70–99)

## 2022-05-19 NOTE — Progress Notes (Signed)
PCP - [employer] Cardiologist - Dorris Carnes  PPM/ICD - Denies Device Orders -  Rep Notified -   Chest x-ray - N/I EKG - 05/19/22 Stress Test - Yes "About 15 years ago" ECHO - Denies Cardiac Cath - About 15 years ago, "nothing wrong"  Sleep Study - Yes no OSA  DM - Type II CBG at PAT appt 107 Fasting Blood Sugar -94-109 this am  Checks Blood Sugar ___every other week  Last dose of GLP1 agonist-  Mounjaro weekly  GLP1 instructions: "stop taking it 7 days before the surgery"  Blood Thinner Instructions:None N/A Aspirin Instructions:None N/A  ERAS Protcol -Yes  COVID TEST- N/A   Anesthesia review: Yes sees cardiology  Patient denies shortness of breath, fever, cough and chest pain at PAT appointment   All instructions explained to the patient, with a verbal understanding of the material. Patient agrees to go over the instructions while at home for a better understanding.  The opportunity to ask questions was provided.

## 2022-05-20 NOTE — Progress Notes (Signed)
Anesthesia Chart Review:  Case: [id number] Date/Time: 05/25/22 0715   Procedures:      Open L4-5, L5-S1 Laminectomy and TLIF, posterior                                                                                                           spinal fusion with removal of rt side synovial cyst     Application of O-Arm   Anesthesia type: General   Pre-op diagnosis: Spondylolisthesis, Lumbar region   Location: [facility] OR ROOM 71 / [place] OR   Surgeons: Judith Part, MD       DISCUSSION: Patient is a 72 year old male scheduled for the above procedure.  History includes never smoker, post-operative N/V, HLD, HTN, DM2, left shoulder rotator cuff repair 09/2021. He had minimal coronary plaque on 10/03/19 CCTA.   He is followed by cardiologist Dr. Dorris Carnes since June 2021. Initially seen for episode of chest pain while traveling to [facility] and ruled out for MI. He subsequently had a CCTA on 10/03/19 that showed coronary calcium score of 17 (47th percentile) and minimal non-obstructive CAD (1-24% proximal and mid LAD, otherwise no significant plaque or stenoses). In early 2022 he had an episode of HR in the 130's with BP 96/59, so had a Zio monitor that showed no significant arrhythmias. Olmesartan decreased At 05/28/21 follow-up, he had not further heart racing. Denied chest pain and SOB.   A1c 5.3%. At PAT, he was advised to hold [facility] for 7 days prior to surgery. Last dose 05/14/22.   He saw Dr. Harrington Challenger within the past year and tolerated rotator cuff repair about six months ago. Minimal CAD by CCTA in 2021. He denied SOB and chest pain. 05/19/22 EKG showed NSR. DM well controlled. Anesthesia team to evaluate on the day of surgery.    VS: BP 124/75   Pulse 62   Temp 36.4 C   Resp 17   Ht 5' 11.5" (1.816 m)   Wt 104.8 kg   SpO2 100%   BMI 31.77 kg/m    PROVIDERS: [employer], MD is PCP  Dorris Carnes, MD is cardiologist   LABS: Labs reviewed: Acceptable for surgery. (all labs ordered are  listed, but only abnormal results are displayed)  Labs Reviewed  GLUCOSE, CAPILLARY - Abnormal; Notable for the following components:      Result Value   Glucose-Capillary 107 (*)    All other components within normal limits  BASIC METABOLIC PANEL - Abnormal; Notable for the following components:   Glucose, Bld 112 (*)    BUN 24 (*)    All other components within normal limits  SURGICAL PCR SCREEN  CBC  HEMOGLOBIN A1C  TYPE AND SCREEN     IMAGES: MRI L-spine 04/13/22: IMPRESSION: - 1.3 cm synovial cyst at L5-S1 arising from the right facet joint which displaces the descending nerve roots ventrally and to the left, with probable right-sided descending nerve root impingement. Mild bilateral neural foraminal narrowing at this level. - Degenerative grade 1 anterolisthesis at L4-L5, with broad disc  bulging, ligamentum flavum hypertrophy and facet arthropathy. Severe subarticular stenosis bilaterally likely impinging the descending L5 nerve roots. Severe right neural foraminal stenosis contacting the exiting right L4 nerve root. Mild left neural foraminal stenosis. - Periarticular marrow edema at the right L4-L5 facet joint, which could also be a source of pain. - Mild subarticular narrowing bilaterally at L3-L4 without impingement.    EKG: 05/19/22: NSR   CV: Long term monitor 05/14/21 - 05/17/21:  Sinus rhythm   Rates 43 to 126 bpm  Average HR 73 bpm    Rare PVC, PAC.   Short burst SVT (7 beats at 141 bpm) Triggered events correlated with SR    CT Coronary 10/03/19: IMPRESSION: 1.  Minimal nonobstructive CAD, CADRADS = 1. [1-24% proximal and mid LAD, otherwise no significant plaque or stenoses]  2. Coronary calcium score of 17. This was 47th percentile for age and sex matched control.   3.  Normal coronary origin with right dominance.   4.  Aortic atherosclerosis.     Past Medical History:  Diagnosis Date   Complication of anesthesia    Diabetes mellitus without  complication (HCC)    Hyperlipidemia    Hypertension    PONV (postoperative nausea and vomiting)     Past Surgical History:  Procedure Laterality Date   CYST REMOVAL NECK  1983   had n & v   EYE SURGERY     LASIK     SHOULDER SURGERY Left 09/2021    MEDICATIONS:  atorvastatin (LIPITOR) 40 MG tablet   celecoxib (CELEBREX) 200 MG capsule   Cholecalciferol (VITAMIN D3) 50 MCG (2000 UT) CAPS   Coenzyme Q10 (COQ10) 200 MG CAPS   docusate sodium (COLACE) 100 MG capsule   Flaxseed, Linseed, (FLAXSEED OIL) 1400 MG CAPS   Glucosamine Sulfate-MSM (GLUCOSAMINE-MSM DS PO)   hydrochlorothiazide (HYDRODIURIL) 25 MG tablet   Melatonin 5 MG CAPS   metFORMIN (GLUCOPHAGE) 500 MG tablet   Misc Natural Products (PROSTATE HEALTH) CAPS   olmesartan (BENICAR) 20 MG tablet   Specialty Vitamins Products (GNP CENTURY ENERGY METABOLISM) TABS   tirzepatide (MOUNJARO) 7.5 MG/0.5ML Pen   TURMERIC PO   No current facility-administered medications for this encounter.    Myra Gianotti, PA-C Surgical Short Stay/Anesthesiology [facility] Phone [phone] [facility] Phone [phone] 05/20/2022 4:49 PM

## 2022-05-20 NOTE — Anesthesia Preprocedure Evaluation (Addendum)
Anesthesia Evaluation  Patient identified by MRN, date of birth, ID band Patient awake    Reviewed: Allergy & Precautions, H&P , NPO status , Patient's Chart, lab work & pertinent test results  History of Anesthesia Complications (+) PONV and history of anesthetic complications  Airway Mallampati: II  TM Distance: >3 FB Neck ROM: Full    Dental no notable dental hx. (+) Teeth Intact, Dental Advisory Given   Pulmonary neg pulmonary ROS   Pulmonary exam normal breath sounds clear to auscultation       Cardiovascular Exercise Tolerance: Good hypertension, Pt. on medications  Rhythm:Regular Rate:Normal     Neuro/Psych negative neurological ROS  negative psych ROS   GI/Hepatic negative GI ROS, Neg liver ROS,,,  Endo/Other  diabetes, Type 2, Oral Hypoglycemic Agents    Renal/GU negative Renal ROS  negative genitourinary   Musculoskeletal  (+) Arthritis , Osteoarthritis,    Abdominal   Peds  Hematology negative hematology ROS (+)   Anesthesia Other Findings   Reproductive/Obstetrics negative OB ROS                             Anesthesia Physical Anesthesia Plan  ASA: 2  Anesthesia Plan: General   Post-op Pain Management: Tylenol PO (pre-op)*   Induction: Intravenous  PONV Risk Score and Plan: 4 or greater and Ondansetron, Dexamethasone and Treatment may vary due to age or medical condition  Airway Management Planned: Oral ETT  Additional Equipment:   Intra-op Plan:   Post-operative Plan: Extubation in OR  Informed Consent: I have reviewed the patients History and Physical, chart, labs and discussed the procedure including the risks, benefits and alternatives for the proposed anesthesia with the patient or authorized representative who has indicated his/her understanding and acceptance.     Dental advisory given  Plan Discussed with: CRNA  Anesthesia Plan Comments: (PAT  note written 05/20/2022 by Shonna Chock, PA-C.  )       Anesthesia Quick Evaluation

## 2022-05-25 ENCOUNTER — Inpatient Hospital Stay (HOSPITAL_COMMUNITY): Payer: Medicare Other

## 2022-05-25 ENCOUNTER — Inpatient Hospital Stay (HOSPITAL_COMMUNITY): Payer: Medicare Other | Admitting: Vascular Surgery

## 2022-05-25 ENCOUNTER — Encounter (HOSPITAL_COMMUNITY)
Admission: RE | Disposition: A | Discharge status: Home / Self Care | Payer: Self-pay | Source: Home / Self Care | Attending: Neurological Surgery

## 2022-05-25 ENCOUNTER — Other Ambulatory Visit: Payer: Self-pay

## 2022-05-25 ENCOUNTER — Inpatient Hospital Stay (HOSPITAL_BASED_OUTPATIENT_CLINIC_OR_DEPARTMENT_OTHER): Payer: Medicare Other | Admitting: Anesthesiology

## 2022-05-25 ENCOUNTER — Encounter (HOSPITAL_COMMUNITY): Payer: Self-pay | Admitting: Neurological Surgery

## 2022-05-25 ENCOUNTER — Inpatient Hospital Stay (HOSPITAL_COMMUNITY)
Admission: RE | Admit: 2022-05-25 | Discharge: 2022-05-26 | DRG: 455 | Disposition: A | Discharge status: Home / Self Care | Payer: Medicare Other | Attending: Neurological Surgery | Admitting: Neurological Surgery

## 2022-05-25 DIAGNOSIS — M4317 Spondylolisthesis, lumbosacral region: Secondary | ICD-10-CM | POA: Diagnosis present

## 2022-05-25 DIAGNOSIS — E785 Hyperlipidemia, unspecified: Secondary | ICD-10-CM | POA: Diagnosis not present

## 2022-05-25 DIAGNOSIS — M4807 Spinal stenosis, lumbosacral region: Secondary | ICD-10-CM | POA: Diagnosis not present

## 2022-05-25 DIAGNOSIS — M5417 Radiculopathy, lumbosacral region: Secondary | ICD-10-CM | POA: Diagnosis not present

## 2022-05-25 DIAGNOSIS — I10 Essential (primary) hypertension: Secondary | ICD-10-CM | POA: Diagnosis not present

## 2022-05-25 DIAGNOSIS — Z8249 Family history of ischemic heart disease and other diseases of the circulatory system: Secondary | ICD-10-CM | POA: Diagnosis not present

## 2022-05-25 DIAGNOSIS — M4316 Spondylolisthesis, lumbar region: Principal | ICD-10-CM | POA: Diagnosis present

## 2022-05-25 DIAGNOSIS — M48061 Spinal stenosis, lumbar region without neurogenic claudication: Secondary | ICD-10-CM | POA: Diagnosis not present

## 2022-05-25 DIAGNOSIS — E119 Type 2 diabetes mellitus without complications: Secondary | ICD-10-CM | POA: Diagnosis present

## 2022-05-25 DIAGNOSIS — Z981 Arthrodesis status: Secondary | ICD-10-CM | POA: Diagnosis not present

## 2022-05-25 DIAGNOSIS — M7138 Other bursal cyst, other site: Secondary | ICD-10-CM

## 2022-05-25 DIAGNOSIS — Z7984 Long term (current) use of oral hypoglycemic drugs: Secondary | ICD-10-CM

## 2022-05-25 DIAGNOSIS — M5416 Radiculopathy, lumbar region: Secondary | ICD-10-CM | POA: Diagnosis not present

## 2022-05-25 DIAGNOSIS — Z841 Family history of disorders of kidney and ureter: Secondary | ICD-10-CM

## 2022-05-25 DIAGNOSIS — Z8042 Family history of malignant neoplasm of prostate: Secondary | ICD-10-CM

## 2022-05-25 HISTORY — PX: TRANSFORAMINAL LUMBAR INTERBODY FUSION (TLIF) WITH PEDICLE SCREW FIXATION 2 LEVEL: SHX6142

## 2022-05-25 LAB — GLUCOSE, CAPILLARY
Glucose-Capillary: 112 mg/dL — ABNORMAL HIGH (ref 70–99)
Glucose-Capillary: 107 mg/dL — ABNORMAL HIGH (ref 70–99)
Glucose-Capillary: 129 mg/dL — ABNORMAL HIGH (ref 70–99)
Glucose-Capillary: 143 mg/dL — ABNORMAL HIGH (ref 70–99)
Glucose-Capillary: 163 mg/dL — ABNORMAL HIGH (ref 70–99)
Glucose-Capillary: 172 mg/dL — ABNORMAL HIGH (ref 70–99)
Glucose-Capillary: 180 mg/dL — ABNORMAL HIGH (ref 70–99)

## 2022-05-25 LAB — ABO/RH: ABO/RH(D): O POS

## 2022-05-25 SURGERY — TRANSFORAMINAL LUMBAR INTERBODY FUSION (TLIF) WITH PEDICLE SCREW FIXATION 2 LEVEL
Anesthesia: General

## 2022-05-25 MED ORDER — CEFAZOLIN SODIUM-DEXTROSE 2-4 GM/100ML-% IV SOLN
2.0000 g | Freq: Three times a day (TID) | INTRAVENOUS | Status: AC
  Administered 2022-05-25 (×2): 2 g via INTRAVENOUS
  Filled 2022-05-25 (×2): qty 100

## 2022-05-25 MED ORDER — PHENOL 1.4 % MT LIQD: 1.0000 | OROMUCOSAL | Status: DC | PRN

## 2022-05-25 MED ORDER — MELATONIN 5 MG PO TABS: 5.0000 mg | ORAL_TABLET | Freq: Every evening | ORAL | Status: DC | PRN

## 2022-05-25 MED ORDER — HYDROMORPHONE HCL 1 MG/ML IJ SOLN
INTRAMUSCULAR | Status: AC
  Filled 2022-05-25: qty 0.5

## 2022-05-25 MED ORDER — MENTHOL 3 MG MT LOZG: 1.0000 | LOZENGE | OROMUCOSAL | Status: DC | PRN

## 2022-05-25 MED ORDER — SCOPOLAMINE 1 MG/3DAYS TD PT72
MEDICATED_PATCH | TRANSDERMAL | Status: AC
  Filled 2022-05-25: qty 1

## 2022-05-25 MED ORDER — PROPOFOL 10 MG/ML IV BOLUS
INTRAVENOUS | Status: AC
  Filled 2022-05-25: qty 20

## 2022-05-25 MED ORDER — CYCLOBENZAPRINE HCL 10 MG PO TABS
ORAL_TABLET | ORAL | Status: AC
  Filled 2022-05-25: qty 1

## 2022-05-25 MED ORDER — LACTATED RINGERS IV SOLN: INTRAVENOUS | Status: DC | PRN

## 2022-05-25 MED ORDER — TIRZEPATIDE 7.5 MG/0.5ML ~~LOC~~ SOAJ: 7.5000 mg | SUBCUTANEOUS | Status: DC

## 2022-05-25 MED ORDER — CHLORHEXIDINE GLUCONATE CLOTH 2 % EX PADS: 6.0000 | MEDICATED_PAD | Freq: Once | CUTANEOUS | Status: DC

## 2022-05-25 MED ORDER — SODIUM CHLORIDE 0.9% FLUSH
3.0000 mL | Freq: Two times a day (BID) | INTRAVENOUS | Status: DC
  Administered 2022-05-25 (×2): 3 mL via INTRAVENOUS

## 2022-05-25 MED ORDER — INSULIN ASPART 100 UNIT/ML IJ SOLN: 0.0000 [IU] | INTRAMUSCULAR | Status: DC | PRN

## 2022-05-25 MED ORDER — DEXAMETHASONE SODIUM PHOSPHATE 10 MG/ML IJ SOLN
INTRAMUSCULAR | Status: AC
  Filled 2022-05-25: qty 1

## 2022-05-25 MED ORDER — LIDOCAINE 2% (20 MG/ML) 5 ML SYRINGE
INTRAMUSCULAR | Status: DC | PRN
  Administered 2022-05-25: 60 mg via INTRAVENOUS

## 2022-05-25 MED ORDER — ONDANSETRON HCL 4 MG/2ML IJ SOLN
INTRAMUSCULAR | Status: AC
  Filled 2022-05-25: qty 2

## 2022-05-25 MED ORDER — ROCURONIUM BROMIDE 10 MG/ML (PF) SYRINGE
PREFILLED_SYRINGE | INTRAVENOUS | Status: AC
  Filled 2022-05-25: qty 10

## 2022-05-25 MED ORDER — PHENYLEPHRINE HCL-NACL 20-0.9 MG/250ML-% IV SOLN
INTRAVENOUS | Status: DC | PRN
  Administered 2022-05-25: 80 ug via INTRAVENOUS
  Administered 2022-05-25: 30 ug/min via INTRAVENOUS

## 2022-05-25 MED ORDER — LIDOCAINE 2% (20 MG/ML) 5 ML SYRINGE
INTRAMUSCULAR | Status: AC
  Filled 2022-05-25: qty 5

## 2022-05-25 MED ORDER — OXYCODONE HCL 5 MG PO TABS
ORAL_TABLET | ORAL | Status: AC
  Filled 2022-05-25: qty 2

## 2022-05-25 MED ORDER — SODIUM CHLORIDE 0.9 % IV SOLN
250.0000 mL | INTRAVENOUS | Status: DC
  Administered 2022-05-25: 250 mL via INTRAVENOUS

## 2022-05-25 MED ORDER — ONDANSETRON HCL 4 MG/2ML IJ SOLN: 4.0000 mg | Freq: Four times a day (QID) | INTRAMUSCULAR | Status: DC | PRN

## 2022-05-25 MED ORDER — CEFAZOLIN SODIUM-DEXTROSE 2-3 GM-%(50ML) IV SOLR
INTRAVENOUS | Status: DC | PRN
  Administered 2022-05-25: 2 g via INTRAVENOUS

## 2022-05-25 MED ORDER — CHLORHEXIDINE GLUCONATE 0.12 % MT SOLN
15.0000 mL | Freq: Once | OROMUCOSAL | Status: AC
  Administered 2022-05-25: 15 mL via OROMUCOSAL
  Filled 2022-05-25: qty 15

## 2022-05-25 MED ORDER — DOCUSATE SODIUM 100 MG PO CAPS
100.0000 mg | ORAL_CAPSULE | Freq: Two times a day (BID) | ORAL | Status: DC
  Administered 2022-05-25 (×2): 100 mg via ORAL
  Filled 2022-05-25 (×2): qty 1

## 2022-05-25 MED ORDER — HYDROMORPHONE HCL 1 MG/ML IJ SOLN: 1.0000 mg | INTRAMUSCULAR | Status: DC | PRN

## 2022-05-25 MED ORDER — FENTANYL CITRATE (PF) 250 MCG/5ML IJ SOLN
INTRAMUSCULAR | Status: DC | PRN
  Administered 2022-05-25 (×5): 50 ug via INTRAVENOUS

## 2022-05-25 MED ORDER — LACTATED RINGERS IV SOLN: INTRAVENOUS | Status: DC

## 2022-05-25 MED ORDER — THROMBIN 5000 UNITS EX SOLR
CUTANEOUS | Status: AC
  Filled 2022-05-25: qty 5000

## 2022-05-25 MED ORDER — ONDANSETRON HCL 4 MG PO TABS: 4.0000 mg | ORAL_TABLET | Freq: Four times a day (QID) | ORAL | Status: DC | PRN

## 2022-05-25 MED ORDER — LIDOCAINE-EPINEPHRINE 1 %-1:100000 IJ SOLN
INTRAMUSCULAR | Status: AC
  Filled 2022-05-25: qty 1

## 2022-05-25 MED ORDER — EPHEDRINE 5 MG/ML INJ
INTRAVENOUS | Status: AC
  Filled 2022-05-25: qty 5

## 2022-05-25 MED ORDER — ACETAMINOPHEN 500 MG PO TABS
1000.0000 mg | ORAL_TABLET | Freq: Once | ORAL | Status: AC
  Administered 2022-05-25: 1000 mg via ORAL
  Filled 2022-05-25: qty 2

## 2022-05-25 MED ORDER — HYDROMORPHONE HCL 1 MG/ML IJ SOLN
INTRAMUSCULAR | Status: DC | PRN
  Administered 2022-05-25 (×2): .5 mg via INTRAVENOUS

## 2022-05-25 MED ORDER — ROCURONIUM BROMIDE 10 MG/ML (PF) SYRINGE
PREFILLED_SYRINGE | INTRAVENOUS | Status: DC | PRN
  Administered 2022-05-25: 10 mg via INTRAVENOUS
  Administered 2022-05-25: 70 mg via INTRAVENOUS
  Administered 2022-05-25: 30 mg via INTRAVENOUS

## 2022-05-25 MED ORDER — SUGAMMADEX SODIUM 200 MG/2ML IV SOLN
INTRAVENOUS | Status: DC | PRN
  Administered 2022-05-25: 200 mg via INTRAVENOUS

## 2022-05-25 MED ORDER — SODIUM CHLORIDE 0.9% FLUSH
3.0000 mL | INTRAVENOUS | Status: DC | PRN
  Administered 2022-05-25: 3 mL via INTRAVENOUS

## 2022-05-25 MED ORDER — 0.9 % SODIUM CHLORIDE (POUR BTL) OPTIME
TOPICAL | Status: DC | PRN
  Administered 2022-05-25: 1000 mL

## 2022-05-25 MED ORDER — HYDROCHLOROTHIAZIDE 25 MG PO TABS
25.0000 mg | ORAL_TABLET | Freq: Every day | ORAL | Status: DC
  Administered 2022-05-25: 25 mg via ORAL
  Filled 2022-05-25: qty 1

## 2022-05-25 MED ORDER — ORAL CARE MOUTH RINSE: 15.0000 mL | Freq: Once | OROMUCOSAL | Status: AC

## 2022-05-25 MED ORDER — IRBESARTAN 150 MG PO TABS
150.0000 mg | ORAL_TABLET | Freq: Every day | ORAL | Status: DC
  Administered 2022-05-25: 150 mg via ORAL
  Filled 2022-05-25: qty 1

## 2022-05-25 MED ORDER — POLYETHYLENE GLYCOL 3350 17 G PO PACK: 17.0000 g | PACK | Freq: Every day | ORAL | Status: DC | PRN

## 2022-05-25 MED ORDER — HYDROMORPHONE HCL 1 MG/ML IJ SOLN
0.2500 mg | INTRAMUSCULAR | Status: DC | PRN
  Administered 2022-05-25 (×2): 0.5 mg via INTRAVENOUS

## 2022-05-25 MED ORDER — CEFAZOLIN SODIUM-DEXTROSE 2-4 GM/100ML-% IV SOLN
2.0000 g | INTRAVENOUS | Status: AC
  Administered 2022-05-25: 2 g via INTRAVENOUS
  Filled 2022-05-25: qty 100

## 2022-05-25 MED ORDER — OXYCODONE HCL 5 MG PO TABS
5.0000 mg | ORAL_TABLET | ORAL | Status: DC | PRN
  Administered 2022-05-25 – 2022-05-26 (×2): 5 mg via ORAL
  Filled 2022-05-25 (×2): qty 1

## 2022-05-25 MED ORDER — METFORMIN HCL 500 MG PO TABS
500.0000 mg | ORAL_TABLET | Freq: Every day | ORAL | Status: DC
  Administered 2022-05-26: 500 mg via ORAL
  Filled 2022-05-25: qty 1

## 2022-05-25 MED ORDER — OXYCODONE HCL 5 MG PO TABS
10.0000 mg | ORAL_TABLET | ORAL | Status: DC | PRN
  Administered 2022-05-25 (×2): 10 mg via ORAL
  Filled 2022-05-25: qty 2

## 2022-05-25 MED ORDER — LIDOCAINE-EPINEPHRINE 1 %-1:100000 IJ SOLN
INTRAMUSCULAR | Status: DC | PRN
  Administered 2022-05-25: 10 mL

## 2022-05-25 MED ORDER — ACETAMINOPHEN 650 MG RE SUPP: 650.0000 mg | RECTAL | Status: DC | PRN

## 2022-05-25 MED ORDER — ALBUMIN HUMAN 5 % IV SOLN: INTRAVENOUS | Status: DC | PRN

## 2022-05-25 MED ORDER — ONDANSETRON HCL 4 MG/2ML IJ SOLN
INTRAMUSCULAR | Status: DC | PRN
  Administered 2022-05-25: 4 mg via INTRAVENOUS

## 2022-05-25 MED ORDER — CYCLOBENZAPRINE HCL 10 MG PO TABS
10.0000 mg | ORAL_TABLET | Freq: Three times a day (TID) | ORAL | Status: DC | PRN
  Administered 2022-05-25 – 2022-05-26 (×2): 10 mg via ORAL
  Filled 2022-05-25: qty 1

## 2022-05-25 MED ORDER — THROMBIN 5000 UNITS EX SOLR: OROMUCOSAL | Status: DC | PRN

## 2022-05-25 MED ORDER — HYDROMORPHONE HCL 1 MG/ML IJ SOLN
INTRAMUSCULAR | Status: AC
  Filled 2022-05-25: qty 1

## 2022-05-25 MED ORDER — DEXAMETHASONE SODIUM PHOSPHATE 10 MG/ML IJ SOLN
INTRAMUSCULAR | Status: DC | PRN
  Administered 2022-05-25: 10 mg via INTRAVENOUS

## 2022-05-25 MED ORDER — ATORVASTATIN CALCIUM 40 MG PO TABS
40.0000 mg | ORAL_TABLET | Freq: Every evening | ORAL | Status: DC
  Administered 2022-05-25: 40 mg via ORAL
  Filled 2022-05-25: qty 1

## 2022-05-25 MED ORDER — PHENYLEPHRINE HCL-NACL 20-0.9 MG/250ML-% IV SOLN
INTRAVENOUS | Status: AC
  Filled 2022-05-25: qty 500

## 2022-05-25 MED ORDER — FENTANYL CITRATE (PF) 250 MCG/5ML IJ SOLN
INTRAMUSCULAR | Status: AC
  Filled 2022-05-25: qty 5

## 2022-05-25 MED ORDER — PROPOFOL 10 MG/ML IV BOLUS
INTRAVENOUS | Status: DC | PRN
  Administered 2022-05-25: 150 mg via INTRAVENOUS

## 2022-05-25 MED ORDER — ACETAMINOPHEN 325 MG PO TABS
650.0000 mg | ORAL_TABLET | ORAL | Status: DC | PRN
  Administered 2022-05-25 – 2022-05-26 (×3): 650 mg via ORAL
  Filled 2022-05-25 (×3): qty 2

## 2022-05-25 MED ORDER — PHENYLEPHRINE 80 MCG/ML (10ML) SYRINGE FOR IV PUSH (FOR BLOOD PRESSURE SUPPORT)
PREFILLED_SYRINGE | INTRAVENOUS | Status: AC
  Filled 2022-05-25: qty 10

## 2022-05-25 SURGICAL SUPPLY — 79 items
ADH SKN CLS APL DERMABOND .7 (GAUZE/BANDAGES/DRESSINGS) ×1
APL SKNCLS STERI-STRIP NONHPOA (GAUZE/BANDAGES/DRESSINGS)
BAG COUNTER SPONGE SURGICOUNT (BAG) ×1 IMPLANT
BAG SPNG CNTER NS LX DISP (BAG) ×1
BAND INSRT 18 STRL LF DISP RB (MISCELLANEOUS) ×2
BAND RUBBER #18 3X1/16 STRL (MISCELLANEOUS) ×2 IMPLANT
BASKET BONE COLLECTION (BASKET) ×1 IMPLANT
BENZOIN TINCTURE PRP APPL 2/3 (GAUZE/BANDAGES/DRESSINGS) IMPLANT
BLADE CLIPPER SURG (BLADE) IMPLANT
BLADE SURG 11 STRL SS (BLADE) ×1 IMPLANT
BUR 14 MATCH 3 (BUR) IMPLANT
BUR MATCHSTICK NEURO 3.0 LAGG (BURR) ×1 IMPLANT
BUR MR8 14CM BALL SYMTRI 5 (BUR) IMPLANT
BUR PRECISION FLUTE 5.0 (BURR) ×1 IMPLANT
BURR 14 MATCH 3 (BUR)
BURR MR8 14CM BALL SYMTRI 5 (BUR)
CAGE EXP CATALYFT 9 (Plate) IMPLANT
CANISTER SUCT 3000ML PPV (MISCELLANEOUS) ×1 IMPLANT
CNTNR URN SCR LID CUP LEK RST (MISCELLANEOUS) ×1 IMPLANT
CONT SPEC 4OZ STRL OR WHT (MISCELLANEOUS) ×1
COVER BACK TABLE 60X90IN (DRAPES) ×1 IMPLANT
COVERAGE SUPPORT O-ARM STEALTH (MISCELLANEOUS) ×1 IMPLANT
DERMABOND ADVANCED .7 DNX12 (GAUZE/BANDAGES/DRESSINGS) ×1 IMPLANT
DRAPE C-ARM 42X72 X-RAY (DRAPES) IMPLANT
DRAPE C-ARMOR (DRAPES) IMPLANT
DRAPE LAPAROTOMY 100X72X124 (DRAPES) ×1 IMPLANT
DRAPE MICROSCOPE SLANT 54X150 (MISCELLANEOUS) ×1 IMPLANT
DRAPE SHEET LG 3/4 BI-LAMINATE (DRAPES) ×4 IMPLANT
DRAPE SURG 17X23 STRL (DRAPES) ×1 IMPLANT
DURAPREP 26ML APPLICATOR (WOUND CARE) ×1 IMPLANT
ELECT REM PT RETURN 9FT ADLT (ELECTROSURGICAL) ×1
ELECTRODE REM PT RTRN 9FT ADLT (ELECTROSURGICAL) ×1 IMPLANT
FEE COVERAGE SUPPORT O-ARM (MISCELLANEOUS) ×1 IMPLANT
GAUZE 4X4 16PLY ~~LOC~~+RFID DBL (SPONGE) IMPLANT
GAUZE SPONGE 4X4 12PLY STRL (GAUZE/BANDAGES/DRESSINGS) IMPLANT
GLOVE BIOGEL PI IND STRL 7.5 (GLOVE) ×2 IMPLANT
GLOVE ECLIPSE 7.5 STRL STRAW (GLOVE) ×2 IMPLANT
GLOVE EXAM NITRILE LRG STRL (GLOVE) IMPLANT
GLOVE EXAM NITRILE XL STR (GLOVE) IMPLANT
GLOVE EXAM NITRILE XS STR PU (GLOVE) IMPLANT
GOWN STRL REUS W/ TWL LRG LVL3 (GOWN DISPOSABLE) ×4 IMPLANT
GOWN STRL REUS W/ TWL XL LVL3 (GOWN DISPOSABLE) IMPLANT
GOWN STRL REUS W/TWL 2XL LVL3 (GOWN DISPOSABLE) IMPLANT
GOWN STRL REUS W/TWL LRG LVL3 (GOWN DISPOSABLE) ×4
GOWN STRL REUS W/TWL XL LVL3 (GOWN DISPOSABLE)
HEMOSTAT POWDER KIT SURGIFOAM (HEMOSTASIS) ×1 IMPLANT
KIT BASIN OR (CUSTOM PROCEDURE TRAY) ×1 IMPLANT
KIT INFUSE SMALL (Orthopedic Implant) IMPLANT
KIT POSITION SURG JACKSON T1 (MISCELLANEOUS) ×1 IMPLANT
KIT TURNOVER KIT B (KITS) ×1 IMPLANT
MARKER SPHERE PSV REFLC NDI (MISCELLANEOUS) ×5 IMPLANT
MILL BONE PREP (MISCELLANEOUS) IMPLANT
NDL HYPO 18GX1.5 BLUNT FILL (NEEDLE) IMPLANT
NDL SPNL 18GX3.5 QUINCKE PK (NEEDLE) IMPLANT
NEEDLE HYPO 18GX1.5 BLUNT FILL (NEEDLE) IMPLANT
NEEDLE HYPO 22GX1.5 SAFETY (NEEDLE) ×1 IMPLANT
NEEDLE SPNL 18GX3.5 QUINCKE PK (NEEDLE) IMPLANT
NS IRRIG 1000ML POUR BTL (IV SOLUTION) ×1 IMPLANT
PACK LAMINECTOMY NEURO (CUSTOM PROCEDURE TRAY) ×1 IMPLANT
PAD ARMBOARD 7.5X6 YLW CONV (MISCELLANEOUS) ×3 IMPLANT
ROD LUMBAR CRVD SOLERA 5.5X55 (Rod) IMPLANT
ROD PED SOLERA 5.5X60 (Rod) IMPLANT
SCREW MAS/SET HEAD FOR 5.5 ROD (Screw) IMPLANT
SCREW MAS/SET STERILE 4PK (Screw) IMPLANT
SCREW SHANK NL 6.5X40 (Screw) IMPLANT
SCREW SHANK NL TITAN 6.5X45 (Screw) IMPLANT
SPIKE FLUID TRANSFER (MISCELLANEOUS) ×1 IMPLANT
SPONGE SURGIFOAM ABS GEL 100 (HEMOSTASIS) IMPLANT
SPONGE T-LAP 4X18 ~~LOC~~+RFID (SPONGE) IMPLANT
STRIP CLOSURE SKIN 1/2X4 (GAUZE/BANDAGES/DRESSINGS) IMPLANT
SUT MNCRL AB 3-0 PS2 18 (SUTURE) ×1 IMPLANT
SUT VIC AB 0 CT1 18XCR BRD8 (SUTURE) ×1 IMPLANT
SUT VIC AB 0 CT1 8-18 (SUTURE) ×2
SUT VIC AB 2-0 CP2 18 (SUTURE) ×1 IMPLANT
SYR 3ML LL SCALE MARK (SYRINGE) IMPLANT
TOWEL GREEN STERILE (TOWEL DISPOSABLE) ×1 IMPLANT
TOWEL GREEN STERILE FF (TOWEL DISPOSABLE) ×1 IMPLANT
TRAY FOLEY MTR SLVR 16FR STAT (SET/KITS/TRAYS/PACK) ×1 IMPLANT
WATER STERILE IRR 1000ML POUR (IV SOLUTION) ×1 IMPLANT

## 2022-05-25 NOTE — Anesthesia Postprocedure Evaluation (Signed)
Anesthesia Post Note  Patient: Malik Bowen  Procedure(s) Performed: Open Lumbar Four-Lumbar Five, Lumbar Five-Sacral One Laminectomy and Transforaminal Lumber Interbody Fusion, posterior spinal fusion with removal of right side synovial cyst Application of O-Arm     Patient location during evaluation: PACU Anesthesia Type: General Level of consciousness: awake and alert Pain management: pain level controlled Vital Signs Assessment: post-procedure vital signs reviewed and stable Respiratory status: spontaneous breathing, nonlabored ventilation and respiratory function stable Cardiovascular status: blood pressure returned to baseline and stable Postop Assessment: no apparent nausea or vomiting Anesthetic complications: no  No notable events documented.  Last Vitals:  Vitals:   05/25/22 1307 05/25/22 1330  BP:  112/61  Pulse:  82  Resp: 16 19  Temp:  (!) 36.2 C  SpO2:  98%    Last Pain:  Vitals:   05/25/22 1237  TempSrc:   PainSc: 0-No pain    LLE Motor Response: Responds to commands (05/25/22 1330) LLE Sensation: Full sensation (05/25/22 1330) RLE Motor Response: Responds to commands (05/25/22 1330) RLE Sensation: Full sensation (05/25/22 1330)      Geoffrey Mankin,W. EDMOND

## 2022-05-25 NOTE — Anesthesia Procedure Notes (Addendum)
Procedure Name: Intubation Date/Time: 05/25/2022 7:50 AM  Performed by: Jenne Campus, CRNAPre-anesthesia Checklist: Patient identified, Emergency Drugs available, Suction available and Patient being monitored Patient Re-evaluated:Patient Re-evaluated prior to induction Oxygen Delivery Method: Circle System Utilized Preoxygenation: Pre-oxygenation with 100% oxygen Induction Type: IV induction Ventilation: Mask ventilation without difficulty Laryngoscope Size: Mac and 4 Grade View: Grade I Tube type: Oral Tube size: 7.5 mm Number of attempts: 1 Airway Equipment and Method: Stylet and Oral airway Placement Confirmation: ETT inserted through vocal cords under direct vision, positive ETCO2 and breath sounds checked- equal and bilateral Secured at: 23 cm Tube secured with: Tape Dental Injury: Teeth and Oropharynx as per pre-operative assessment

## 2022-05-25 NOTE — Transfer of Care (Signed)
Immediate Anesthesia Transfer of Care Note  Patient: Malik Bowen  Procedure(s) Performed: Open Lumbar Four-Lumbar Five, Lumbar Five-Sacral One Laminectomy and Transforaminal Lumber Interbody Fusion, posterior spinal fusion with removal of right side synovial cyst Application of O-Arm  Patient Location: PACU  Anesthesia Type:General  Level of Consciousness: sedated and drowsy  Airway & Oxygen Therapy: Patient Spontanous Breathing and Patient connected to face mask oxygen  Post-op Assessment: Report given to RN and Post -op Vital signs reviewed and stable  Post vital signs: Reviewed and stable  Last Vitals:  Vitals Value Taken Time  BP 114/55 05/25/22 1237  Temp    Pulse 76 05/25/22 1240  Resp 17 05/25/22 1240  SpO2 98 % 05/25/22 1240  Vitals shown include unvalidated device data.  Last Pain:  Vitals:   05/25/22 0624  TempSrc: Oral  PainSc: 2          Complications: No notable events documented.

## 2022-05-25 NOTE — H&P (Signed)
Surgical H&P Update  HPI: 72 y.o. with a history of back and RLE pain. Workup showed a right synovial cyst at 5-1 and degen spondy at L4-5 with foraminal / lateral recess / canal stenosis. No changes in health since they were last seen. Still having the above and wishes to proceed with surgery.  PMHx:  Past Medical History:  Diagnosis Date   Complication of anesthesia    Diabetes mellitus without complication ([place])    Hyperlipidemia    Hypertension    PONV (postoperative nausea and vomiting)    FamHx:  Family History  Problem Relation Age of Onset   Heart failure Father    Heart failure Brother    Prostate cancer Brother    Kidney failure Brother    SocHx:  reports that he has never smoked. He has never used smokeless tobacco. He reports that he does not drink alcohol and does not use drugs.  Physical Exam: Strength 5/5 x4 and SILTx4  Assesment/Plan: 72 y.o. man with back and RLE pain, here for L4-5 / 5-1 decompression / synovial cyst resection / open TLIF. Risks, benefits, and alternatives discussed and the patient would like to continue with surgery.  -OR today -3C post-op  Judith Part, MD 05/25/22 7:28 AM

## 2022-05-25 NOTE — Op Note (Signed)
PATIENT: Malik Bowen  DAY OF SURGERY: 05/25/22   PRE-OPERATIVE DIAGNOSIS:  Lumbar spondylolisthesis, lumbar synovial cyst, lumbar radiculopathy   POST-OPERATIVE DIAGNOSIS:  Same   PROCEDURE:  L4, L5 laminectomies, resection of synovial cyst, L4-5 TLIF, L4-5/L5-S1 posterolateral instrumented fusion, use of intraoperative frameless stereotaxy and intraoperative CT   SURGEON:  Surgeon(s) and Role:    Judith Part, MD - Primary    Norm Parcel - Assisting   ANESTHESIA: ETGA   BRIEF HISTORY: This is a 72 year old man who presented with back and RLE pain. The patient was found to have a large R synovial cyst at 5-1 with multifactorial stenosis and a mobile spondylolisthesis at 4-5, I therefore recommended an open 4-5/5-1 decompressiona nd instrumented fusion with resection of the synovial cyst. This was discussed with the patient as well as risks, benefits, and alternatives and wished to proceed with surgery.   OPERATIVE DETAIL: The patient was taken to the operating room and anesthesia was induced by the anesthesia team. They were placed on the OR table in the prone position with padding of all pressure points. A formal time out was performed with two patient identifiers and confirmed the operative site. The operative site was marked, hair was clipped with surgical clippers, the area was then prepped and draped in a sterile fashion. A midline incision was placed to expose from L4 to S1. Subperiosteal dissection was performed bilaterally and a a spinous process clamp was applied and secured, followed by attachment of a reference array.   The field was draped and the O-arm was brought into the field. An intra-op CT was performed, sent to the Stealth navigation station, registered to the patient's anatomy, and confirmed with landmarks with acceptable fit. Stereotactic spinal navigation was utilized throughout the procedure for planning and placement of pedicle screw  trajectories.  Instrumentation was then performed. Navigation was used to guide placement of bilateral pedicle screws ([employer]) at L4, L5, and S1 bilaterally. These were placed with navigated instruments by localizing the pedicle, drilling a pilot hole, cannulating the pedicle with an awl-tap, palpating for pedicle wall breaches, and then placing the screw.   TLIF was then performed at L4-5 by removing the right facet completely, performing a discectomy, prepping the endplates, and inserting a titanium expandable interbody using stereotaxy. I attempted the same at L5-S1, but the disc space was too narrow dorsally and I was concerned that even placement of a small interbody would violate the endplates, so I opted for just a posterolateral fusion at 5-1.   We obtained an intra-op cross-table, which showed good position except, despite changing wig-wag, possible violation of the L5 foramen. I therefore opted to bring the O-arm back into the field and got a second spin to confirm good positioning and there was no violation of the foramen.   Decompression was performed, which consisted of laminectomies at L4 and L5 with identification and en bloc resection of the synovial cyst.   These were connected with rods bilaterally and final tightened according to manufacturer torque specifications. The bone was thoroughly decorticated over the fusion surfaces, hemostasis was obtained, and the previously resected bone fragments were morselized and used as autograft.   All instrument and sponge counts were correct, the incision was then closed in layers. The patient was then returned to anesthesia for emergence. No apparent complications at the completion of the procedure.   EBL:  628m   DRAINS: none   SPECIMENS: none   TJudith Part MD 05/25/22 12:19  PM

## 2022-05-26 ENCOUNTER — Encounter (HOSPITAL_COMMUNITY): Payer: Self-pay | Admitting: Neurological Surgery

## 2022-05-26 DIAGNOSIS — M7138 Other bursal cyst, other site: Secondary | ICD-10-CM | POA: Diagnosis present

## 2022-05-26 DIAGNOSIS — M4316 Spondylolisthesis, lumbar region: Secondary | ICD-10-CM | POA: Diagnosis present

## 2022-05-26 DIAGNOSIS — Z8249 Family history of ischemic heart disease and other diseases of the circulatory system: Secondary | ICD-10-CM | POA: Diagnosis not present

## 2022-05-26 DIAGNOSIS — M5417 Radiculopathy, lumbosacral region: Secondary | ICD-10-CM | POA: Diagnosis present

## 2022-05-26 DIAGNOSIS — E785 Hyperlipidemia, unspecified: Secondary | ICD-10-CM | POA: Diagnosis present

## 2022-05-26 DIAGNOSIS — Z841 Family history of disorders of kidney and ureter: Secondary | ICD-10-CM | POA: Diagnosis not present

## 2022-05-26 DIAGNOSIS — M48061 Spinal stenosis, lumbar region without neurogenic claudication: Secondary | ICD-10-CM | POA: Diagnosis present

## 2022-05-26 DIAGNOSIS — M4317 Spondylolisthesis, lumbosacral region: Secondary | ICD-10-CM | POA: Diagnosis present

## 2022-05-26 DIAGNOSIS — Z8042 Family history of malignant neoplasm of prostate: Secondary | ICD-10-CM | POA: Diagnosis not present

## 2022-05-26 DIAGNOSIS — I10 Essential (primary) hypertension: Secondary | ICD-10-CM | POA: Diagnosis present

## 2022-05-26 DIAGNOSIS — E119 Type 2 diabetes mellitus without complications: Secondary | ICD-10-CM | POA: Diagnosis present

## 2022-05-26 DIAGNOSIS — M4807 Spinal stenosis, lumbosacral region: Secondary | ICD-10-CM | POA: Diagnosis present

## 2022-05-26 DIAGNOSIS — M5416 Radiculopathy, lumbar region: Secondary | ICD-10-CM | POA: Diagnosis present

## 2022-05-26 DIAGNOSIS — Z7984 Long term (current) use of oral hypoglycemic drugs: Secondary | ICD-10-CM | POA: Diagnosis not present

## 2022-05-26 LAB — GLUCOSE, CAPILLARY: Glucose-Capillary: 130 mg/dL — ABNORMAL HIGH (ref 70–99)

## 2022-05-26 MED ORDER — OXYCODONE HCL 5 MG PO TABS: 5.0000 mg | ORAL_TABLET | ORAL | 0 refills | Status: DC | PRN

## 2022-05-26 MED ORDER — ALUM & MAG HYDROXIDE-SIMETH 200-200-20 MG/5ML PO SUSP
30.0000 mL | Freq: Four times a day (QID) | ORAL | Status: DC | PRN
  Administered 2022-05-26: 30 mL via ORAL
  Filled 2022-05-26: qty 30

## 2022-05-26 MED ORDER — CYCLOBENZAPRINE HCL 10 MG PO TABS: 10.0000 mg | ORAL_TABLET | Freq: Three times a day (TID) | ORAL | 0 refills | Status: DC | PRN

## 2022-05-26 NOTE — Progress Notes (Signed)
Patient alert and oriented, mae's well, voiding adequate amount of urine, swallowing without difficulty, no c/o pain at time of discharge. Patient discharged home with family. Script and discharged instructions given to patient. Patient and family stated understanding of instructions given. Patient has an appointment with Dr. Ostergard   

## 2022-05-26 NOTE — Evaluation (Signed)
Occupational Therapy Evaluation Patient Details Name: Malik Bowen MRN: [id number] DOB: 1950-11-23 Today's Date: 05/26/2022   History of Present Illness 72 y.o. man who underwent L4-5 / 5-1 decompression / synovial cyst resection / open TLIF. PMHx: , HLD, HTN, DM2, left shoulder rotator cuff repair 09/2021   Clinical Impression   Malik Bowen was evaluated s/p the above spine surgery. He is indep at baseline. Upon evaluation he had mild limitations due to back pain, precautions and knowledge of compensatory techniques. Overall he required generalized superivsion A for all mobility and ADLs without AD. Provided cues and education on spinal precautions and compensatory techniques throughout, handout provided and pt demonstrated great recall. Pt does not require further acute OT services. Recommend d/c home with support of family.        Recommendations for follow up therapy are one component of a multi-disciplinary discharge planning process, led by the attending physician.  Recommendations may be updated based on patient status, additional functional criteria and insurance authorization.   Follow Up Recommendations  No OT follow up     Assistance Recommended at Discharge PRN  Patient can return home with the following A little help with walking and/or transfers;A little help with bathing/dressing/bathroom;Assist for transportation;Assistance with cooking/housework    Functional Status Assessment  Patient has had a recent decline in their functional status and demonstrates the ability to make significant improvements in function in a reasonable and predictable amount of time.  Equipment Recommendations  None recommended by OT       Precautions / Restrictions Precautions Precautions: Fall;Back Precaution Booklet Issued: Yes (comment) Restrictions Weight Bearing Restrictions: No      Mobility Bed Mobility Overal bed mobility: Needs Assistance             General bed mobility comments:  OOB upon arrival, verbally reviewed log roll    Transfers Overall transfer level: Needs assistance Equipment used: Rolling walker (2 wheels) Transfers: Sit to/from Stand Sit to Stand: Modified independent (Device/Increase time)                  Balance Overall balance assessment: No apparent balance deficits (not formally assessed)           ADL either performed or assessed with clinical judgement   ADL Overall ADL's : Needs assistance/impaired Eating/Feeding: Independent   Grooming: Supervision/safety;Sitting   Upper Body Bathing: Supervision/ safety;Sitting   Lower Body Bathing: Supervison/ safety;Sit to/from stand   Upper Body Dressing : Supervision/safety;Sitting   Lower Body Dressing: Supervision/safety;Sit to/from stand;Cueing for compensatory techniques   Toilet Transfer: Modified Independent;Ambulation   Toileting- Clothing Manipulation and Hygiene: Supervision/safety;Sit to/from stand       Functional mobility during ADLs: Modified independent General ADL Comments: cues provided throughout for compensatory techniques and back precautions, no physical assist required.     Vision Baseline Vision/History: 0 No visual deficits Vision Assessment?: No apparent visual deficits     Perception Perception Perception Tested?: No   Praxis Praxis Praxis tested?: Not tested    Pertinent Vitals/Pain Pain Assessment Pain Assessment: Faces Faces Pain Scale: Hurts little more Pain Location: back Pain Descriptors / Indicators: Discomfort, Grimacing, Guarding Pain Intervention(s): Limited activity within patient's tolerance, Monitored during session     Hand Dominance     Extremity/Trunk Assessment Upper Extremity Assessment Upper Extremity Assessment: Overall WFL for tasks assessed   Lower Extremity Assessment Lower Extremity Assessment: Defer to PT evaluation   Cervical / Trunk Assessment Cervical / Trunk Assessment: Back Surgery   Communication  Communication  Communication: No difficulties   Cognition Arousal/Alertness: Awake/alert Behavior During Therapy: WFL for tasks assessed/performed Overall Cognitive Status: Within Functional Limits for tasks assessed         General Comments  VSS on RA            Home Living Family/patient expects to be discharged to:: Private residence Living Arrangements: Spouse/significant other Available Help at Discharge: Family;Available PRN/intermittently Type of Home: House Home Access: Stairs to enter     Home Layout: One level     Bathroom Shower/Tub: Occupational psychologist: Handicapped height     Home Equipment: None   Additional Comments: wife works 1-7 daily      Prior Functioning/Environment Prior Level of Function : Independent/Modified Independent;Driving             Mobility Comments: no AD ADLs Comments: indep        OT Problem List: Decreased range of motion;Decreased activity tolerance;Decreased safety awareness;Decreased knowledge of use of DME or AE;Pain         OT Goals(Current goals can be found in the care plan section) Acute Rehab OT Goals Patient Stated Goal: home OT Goal Formulation: With patient Time For Goal Achievement: 06/09/22 Potential to Achieve Goals: Good   AM-PAC OT "6 Clicks" Daily Activity     Outcome Measure Help from another person eating meals?: None Help from another person taking care of personal grooming?: A Little Help from another person toileting, which includes using toliet, bedpan, or urinal?: A Little Help from another person bathing (including washing, rinsing, drying)?: A Little Help from another person to put on and taking off regular upper body clothing?: None Help from another person to put on and taking off regular lower body clothing?: A Little 6 Click Score: 20   End of Session Nurse Communication: Mobility status  Activity Tolerance: Patient tolerated treatment well Patient left: in  chair;with call bell/phone within reach  OT Visit Diagnosis: Other abnormalities of gait and mobility (R26.89);Muscle weakness (generalized) (M62.81)                Time: [id number] OT Time Calculation (min): 18 min Charges:  OT General Charges $OT Visit: 1 Visit OT Evaluation $OT Eval Low Complexity: [place], OTR/L [place] [facility] [place] Communication Preferred   Elliot Cousin 05/26/2022, 8:52 AM

## 2022-05-26 NOTE — Discharge Summary (Signed)
Discharge Summary  Date of Admission: 05/25/2022  Date of Discharge: 05/26/22  Attending Physician: Emelda Brothers, MD  Hospital Course: Patient was admitted following an uncomplicated  L4, L5 laminectomies, resection of synovial cyst, L4-5 TLIF, L4-5/L5-S1 posterolateral instrumented fusion. They were recovered in PACU and transferred to [facility]. Their preop symptoms were improved, their hospital course was uncomplicated and the patient was discharged home today. They will follow up in clinic with me in clinic in 2 weeks.  Neurologic exam at discharge:  Strength 5/5 x4 and SILTx4, incision c/d/I   Discharge diagnosis: lumbar radiculopathy and spondylolisthesis   Collene Schlichter, PA-C 05/26/22 8:58 AM

## 2022-05-26 NOTE — Progress Notes (Signed)
Neurosurgery Service Progress Note  Subjective: No acute events overnight. Reports complete resolution of severe pre-op RLE radicular pain. Back pain as expected. No voiced complaints.    Objective: Vitals:   05/25/22 1946 05/25/22 2357 05/26/22 0413 05/26/22 0725  BP: (!) 106/56 100/61 (!) 99/55 104/78  Pulse: 83 77 71 63  Resp: '20 20 20 20  '$ Temp: 99.6 F (37.6 C) 98.9 F (37.2 C) 98.8 F (37.1 C) 97.7 F (36.5 C)  TempSrc: Oral Oral Oral Oral  SpO2: 100% 97% 96% 100%  Weight:      Height:        Physical Exam: Strength 5/5 x4 and SILTx4, incision c/d/I   Assessment & Plan: 72 y.o. male s/p  L4, L5 laminectomies, resection of synovial cyst, L4-5 TLIF, L4-5/L5-S1 posterolateral instrumented fusion, recovering well.  -PT/OT -discharge home today  Norm Parcel, PA-C 05/26/22 8:57 AM

## 2022-05-26 NOTE — Progress Notes (Signed)
PT Cancellation Note and Discharge  Patient Details Name: Malik Bowen MRN: [id number] DOB: [birth year]   Cancelled Treatment:    Reason Eval/Treat Not Completed: PT screened, no needs identified, will sign off. Discussed pt case with OT who reports pt is currently mobilizing without assistance and does not require a formal PT evaluation at this time. PT signing off. If needs change, please reconsult.     Thelma Comp 05/26/2022, 9:39 AM  Rolinda Roan, PT, DPT Acute Rehabilitation Services Secure Chat Preferred Office: [phone]

## 2022-06-09 DIAGNOSIS — E782 Mixed hyperlipidemia: Secondary | ICD-10-CM | POA: Diagnosis not present

## 2022-06-09 DIAGNOSIS — E1159 Type 2 diabetes mellitus with other circulatory complications: Secondary | ICD-10-CM | POA: Diagnosis not present

## 2022-06-09 DIAGNOSIS — G4733 Obstructive sleep apnea (adult) (pediatric): Secondary | ICD-10-CM | POA: Diagnosis not present

## 2022-06-09 DIAGNOSIS — I10 Essential (primary) hypertension: Secondary | ICD-10-CM | POA: Diagnosis not present

## 2022-06-09 DIAGNOSIS — E7849 Other hyperlipidemia: Secondary | ICD-10-CM | POA: Diagnosis not present

## 2022-07-12 DIAGNOSIS — Z0001 Encounter for general adult medical examination with abnormal findings: Secondary | ICD-10-CM | POA: Diagnosis not present

## 2022-07-21 DIAGNOSIS — M4326 Fusion of spine, lumbar region: Secondary | ICD-10-CM | POA: Diagnosis not present

## 2022-08-04 NOTE — Progress Notes (Signed)
[facility] - Clinic Note  08/06/2022     CHIEF COMPLAINT Patient presents for Retina Follow Up   HISTORY OF PRESENT ILLNESS: Malik Bowen is a 72 y.o. male who presents to the clinic today for:   HPI     Retina Follow Up   Patient presents with  Diabetic Retinopathy.  In both eyes.  This started 1 year ago.  Duration of 1 year.  Since onset it is stable.  I, the attending physician,  performed the HPI with the patient and updated documentation appropriately.        Comments   1 year retina follow up DM pt is reporting no vision changes noticed he denies flashes but has some floaters that are without changes pt last reading was 110 about a week ago his last A1C 5.4      Last edited by Rennis Chris, MD on 08/08/2022  2:49 AM.    Pt states last A1c was 5.3 on 02.21.24, he is taking metformin, no changes in vision, he sees Dr. Daphine Deutscher for routine eye exams  Referring physician: Shawnie Dapper, PA-C [id number] [place],  [place] [address]  HISTORICAL INFORMATION:   Selected notes from the MEDICAL RECORD NUMBER Self referred for DM exam   CURRENT MEDICATIONS: No current outpatient medications on file. (Ophthalmic Drugs)   No current facility-administered medications for this visit. (Ophthalmic Drugs)   Current Outpatient Medications (Other)  Medication Sig   atorvastatin (LIPITOR) 40 MG tablet Take 40 mg by mouth every evening.   celecoxib (CELEBREX) 200 MG capsule Take 1 capsule (200 mg total) by mouth in the morning.   Cholecalciferol (VITAMIN D3) 50 MCG (2000 UT) CAPS Take 2,000 Units by mouth at bedtime.   Coenzyme Q10 (COQ10) 200 MG CAPS Take 200 mg by mouth at bedtime.   cyclobenzaprine (FLEXERIL) 10 MG tablet Take 1 tablet (10 mg total) by mouth 3 (three) times daily as needed for muscle spasms.   docusate sodium (COLACE) 100 MG capsule Take 100 mg by mouth at bedtime.   Flaxseed, Linseed, (FLAXSEED OIL) 1400 MG CAPS Take 1 capsule by  mouth at bedtime.   Glucosamine Sulfate-MSM (GLUCOSAMINE-MSM DS PO) Take 1 tablet by mouth in the morning and at bedtime.   hydrochlorothiazide (HYDRODIURIL) 25 MG tablet Take 25 mg by mouth in the morning.   Melatonin 5 MG CAPS Take 5 mg by mouth at bedtime as needed (sleep).   metFORMIN (GLUCOPHAGE) 500 MG tablet Take 500 mg by mouth daily with breakfast.    Misc Natural Products (PROSTATE HEALTH) CAPS Take 1 capsule by mouth in the morning and at bedtime. Saw Palmetto with Pygeum and Nettle Root   olmesartan (BENICAR) 20 MG tablet TAKE 1 TABLET BY MOUTH DAILY   oxyCODONE (OXY IR/ROXICODONE) 5 MG immediate release tablet Take 1 tablet (5 mg total) by mouth every 4 (four) hours as needed for severe pain ((score 4 to 6)).   Specialty Vitamins Products [facility] CENTURY ENERGY METABOLISM) TABS Take 0.5 tablets by mouth daily. [employer] and Metabolism   TURMERIC PO Take 1,000 mg by mouth at bedtime.   tirzepatide [facility]) 7.5 MG/0.5ML Pen Inject 7.5 mg into the skin every Friday. (Patient not taking: Reported on 08/06/2022)   No current facility-administered medications for this visit. (Other)   REVIEW OF SYSTEMS: ROS   Positive for: Musculoskeletal, Endocrine, Eyes Negative for: Constitutional, Gastrointestinal, Neurological, Skin, Genitourinary, HENT, Cardiovascular, Respiratory, Psychiatric, Allergic/Imm, Heme/Lymph Last edited  by Etheleen Mayhew, COT on 08/06/2022  8:25 AM.     ALLERGIES Allergies  Allergen Reactions   Other     Walnuts and strawberrys cause mouth sores   PAST MEDICAL HISTORY Past Medical History:  Diagnosis Date   Complication of anesthesia    Diabetes mellitus without complication (HCC)    Hyperlipidemia    Hypertension    PONV (postoperative nausea and vomiting)    Past Surgical History:  Procedure Laterality Date   CYST REMOVAL NECK  1983   had n & v   EYE SURGERY     LASIK     SHOULDER SURGERY Left 09/2021   TRANSFORAMINAL LUMBAR  INTERBODY FUSION (TLIF) WITH PEDICLE SCREW FIXATION 2 LEVEL N/A 05/25/2022   Procedure: Open Lumbar Four-Lumbar Five, Lumbar Five-Sacral One Laminectomy and Transforaminal Lumber Interbody Fusion, posterior spinal fusion with removal of right side synovial cyst;  Surgeon: Jadene Pierini, MD;  Location: [facility] OR;  Service: Neurosurgery;  Laterality: N/A;   FAMILY HISTORY Family History  Problem Relation Age of Onset   Heart failure Father    Heart failure Brother    Prostate cancer Brother    Kidney failure Brother    SOCIAL HISTORY Social History   Tobacco Use   Smoking status: Never   Smokeless tobacco: Never  Vaping Use   Vaping Use: Never used  Substance Use Topics   Alcohol use: No   Drug use: No       OPHTHALMIC EXAM:  Base Eye Exam     Visual Acuity (Snellen - Linear)       Right Left   Dist [place] 20/25 20/20 -1         Tonometry (Tonopen, 8:29 AM)       Right Left   Pressure 17 16         Pupils       Pupils Dark Light Shape React APD   Right PERRL 3 2 Round Brisk None   Left PERRL 3 2 Round Brisk None         Visual Fields       Left Right    Full Full         Extraocular Movement       Right Left    Full, Ortho Full, Ortho         Neuro/Psych     Oriented x3: Yes   Mood/Affect: Normal         Dilation     Both eyes: 2.5% Phenylephrine @ 8:29 AM           Slit Lamp and Fundus Exam     Slit Lamp Exam       Right Left   Lids/Lashes Dermatochalasis - upper lid, mild MGD Dermatochalasis - upper lid, Meibomian gland dysfunction   Conjunctiva/Sclera White and quiet White and quiet   Cornea Arcus, trace tear film debris, well healed lasik flap Arcus, trace tear film debris, well healed lasik flap   Anterior Chamber Deep and quiet Deep and quiet   Iris Round and dilated, No NVI Round and dilated, No NVI   Lens 2-3+ Nuclear sclerosis, 2-3+ Cortical cataract 2-3+ Nuclear sclerosis, 2-3+ Cortical cataract   Anterior Vitreous  Vitreous syneresis, Posterior vitreous detachment, vitreous condesations Vitreous syneresis, Posterior vitreous detachment, vitreous condensations         Fundus Exam       Right Left   Disc pink and sharp, Tilted disc, mild temporal Peripapillary atrophy  pink and sharp, Tilted disc, Temporal Peripapillary atrophy and pigment   C/D Ratio 0.4 0.3   Macula Flat, good foveal reflex, mild Retinal pigment epithelial mottling, No heme or edema Flat, good foveal reflex, mild Retinal pigment epithelial mottling, No heme or edema   Vessels attenuated, mild tortuosity mild attenuation, mild tortuosity   Periphery Attached, mild paving stone inferiorly, mild pigmented Chorioretinal scar at 0730, no heme Attached, mild peripheral CR atrophy temporally, no heme           Refraction     Wearing Rx       Sphere   Right    Left -    Type: PAL            IMAGING AND PROCEDURES  Imaging and Procedures for @TODAY @  OCT, Retina - OU - Both Eyes       Right Eye Quality was good. Central Foveal Thickness: 292. Progression has been stable. Findings include normal foveal contour, no IRF, no SRF, epiretinal membrane (Trace ERM and pucker).   Left Eye Quality was good. Central Foveal Thickness: 284. Progression has been stable. Findings include normal foveal contour, no IRF, no SRF (Trace ERM superior macula).   Notes *Images captured and stored on drive  Diagnosis / Impression:  No DME OU OD: trace ERM and pucker  Clinical management:  See below  Abbreviations: NFP - Normal foveal profile. CME - cystoid macular edema. PED - pigment epithelial detachment. IRF - intraretinal fluid. SRF - subretinal fluid. EZ - ellipsoid zone. ERM - epiretinal membrane. ORA - outer retinal atrophy. ORT - outer retinal tubulation. SRHM - subretinal hyper-reflective material            ASSESSMENT/PLAN:    ICD-10-CM   1. Diabetes mellitus type 2 without retinopathy (HCC)  E11.9 OCT, Retina - OU -  Both Eyes    2. Long term (current) use of oral hypoglycemic drugs  Z79.84     3. Long-term (current) use of injectable non-insulin antidiabetic drugs  Z79.85     4. Posterior vitreous detachment of both eyes  H43.813     5. Epiretinal membrane (ERM) of right eye  H35.371     6. Hx of LASIK  Z98.890     7. Combined forms of age-related cataract of both eyes  H25.813       1-3. Diabetes mellitus, type 2 without retinopathy  - last A1c; 5.3 on 02.21.24  - The incidence, risk factors for progression, natural history and treatment options for diabetic retinopathy  were discussed with patient.    - The need for close monitoring of blood glucose, blood pressure, and serum lipids, avoiding cigarette or any type of tobacco, and the need for long term follow up was also discussed with patient.v  - f/u in 1 year, sooner prn  4. PVD / vitreous syneresis  - fairly prominent vitreous condensations  - stable   - Discussed findings and prognosis  - No RT or RD on 360 peripheral exam  - Reviewed s/s of RT/RD  - Strict return precautions for any such RT/RD signs/symptoms  5. Epiretinal membrane, OD  - OCT shows tr ERM and pucker - BCVA 20/20 - asymptomatic, no metamorphopsia - no indication for surgery at this time - monitor for now - f/u 1 year -- DFE/OCT  6. Hx of Lasik OU-   - doing well  - monitor  7. Combined forma age-related cataracts OU-   - The symptoms of cataract, surgical options,  and treatments and risks were discussed with patient.  - discussed diagnosis and progression  - not yet visually significant  - monitor for now  Ophthalmic Meds Ordered this visit:  No orders of the defined types were placed in this encounter.    Return in about 1 year (around 08/06/2023) for f/u DM exam, DFE, OCT.  There are no Patient Instructions on file for this visit.  This document serves as a record of services personally performed by Karie Chimera, MD, PhD. It was created on their  behalf by Berlin Hun COT, an ophthalmic technician. The creation of this record is the provider's dictation and/or activities during the visit.    Electronically signed by: Berlin Hun COT 08/04/2022 2:50 AM  This document serves as a record of services personally performed by Karie Chimera, MD, PhD. It was created on their behalf by Glee Arvin. Manson Passey, OA an ophthalmic technician. The creation of this record is the provider's dictation and/or activities during the visit.    Electronically signed by: Glee Arvin. Manson Passey, [place] 05.10.2024 2:50 AM  Karie Chimera, M.D., Ph.D. Diseases & Surgery of the Retina and Vitreous [facility] 08/06/2022   I have reviewed the above documentation for accuracy and completeness, and I agree with the above. Karie Chimera, M.D., Ph.D. 08/08/22 2:52 AM   Abbreviations: M myopia (nearsighted); A astigmatism; H hyperopia (farsighted); P presbyopia; Mrx spectacle prescription;  CTL contact lenses; OD right eye; OS left eye; OU both eyes  XT exotropia; ET esotropia; PEK punctate epithelial keratitis; PEE punctate epithelial erosions; DES dry eye syndrome; MGD meibomian gland dysfunction; ATs artificial tears; PFAT's preservative free artificial tears; NSC nuclear sclerotic cataract; PSC posterior subcapsular cataract; ERM epi-retinal membrane; PVD posterior vitreous detachment; RD retinal detachment; DM diabetes mellitus; DR diabetic retinopathy; NPDR non-proliferative diabetic retinopathy; PDR proliferative diabetic retinopathy; CSME clinically significant macular edema; DME diabetic macular edema; dbh dot blot hemorrhages; CWS cotton wool spot; POAG primary open angle glaucoma; C/D cup-to-disc ratio; HVF humphrey visual field; GVF goldmann visual field; OCT optical coherence tomography; IOP intraocular pressure; BRVO Branch retinal vein occlusion; CRVO central retinal vein occlusion; CRAO central retinal artery occlusion; BRAO branch  retinal artery occlusion; RT retinal tear; SB scleral buckle; PPV pars plana vitrectomy; VH Vitreous hemorrhage; PRP panretinal laser photocoagulation; IVK intravitreal kenalog; VMT vitreomacular traction; MH Macular hole;  NVD neovascularization of the disc; NVE neovascularization elsewhere; AREDS age related eye disease study; ARMD age related macular degeneration; POAG primary open angle glaucoma; EBMD epithelial/anterior basement membrane dystrophy; ACIOL anterior chamber intraocular lens; IOL intraocular lens; PCIOL posterior chamber intraocular lens; Phaco/IOL phacoemulsification with intraocular lens placement; PRK photorefractive keratectomy; LASIK laser assisted in situ keratomileusis; HTN hypertension; DM diabetes mellitus; COPD chronic obstructive pulmonary disease

## 2022-08-06 ENCOUNTER — Encounter (INDEPENDENT_AMBULATORY_CARE_PROVIDER_SITE_OTHER): Payer: Self-pay | Admitting: Ophthalmology

## 2022-08-06 ENCOUNTER — Ambulatory Visit (INDEPENDENT_AMBULATORY_CARE_PROVIDER_SITE_OTHER): Payer: Medicare Other | Admitting: Ophthalmology

## 2022-08-06 DIAGNOSIS — E119 Type 2 diabetes mellitus without complications: Secondary | ICD-10-CM

## 2022-08-06 DIAGNOSIS — H25813 Combined forms of age-related cataract, bilateral: Secondary | ICD-10-CM | POA: Diagnosis not present

## 2022-08-06 DIAGNOSIS — Z7984 Long term (current) use of oral hypoglycemic drugs: Secondary | ICD-10-CM

## 2022-08-06 DIAGNOSIS — Z9889 Other specified postprocedural states: Secondary | ICD-10-CM

## 2022-08-06 DIAGNOSIS — H43813 Vitreous degeneration, bilateral: Secondary | ICD-10-CM | POA: Diagnosis not present

## 2022-08-06 DIAGNOSIS — Z7985 Long-term (current) use of injectable non-insulin antidiabetic drugs: Secondary | ICD-10-CM | POA: Diagnosis not present

## 2022-08-06 DIAGNOSIS — H35371 Puckering of macula, right eye: Secondary | ICD-10-CM | POA: Diagnosis not present

## 2022-08-08 ENCOUNTER — Encounter (INDEPENDENT_AMBULATORY_CARE_PROVIDER_SITE_OTHER): Payer: Self-pay | Admitting: Ophthalmology

## 2022-08-10 ENCOUNTER — Other Ambulatory Visit: Payer: Self-pay

## 2022-08-10 ENCOUNTER — Ambulatory Visit (HOSPITAL_COMMUNITY): Payer: Medicare Other | Attending: Physician Assistant | Admitting: Physical Therapy

## 2022-08-10 DIAGNOSIS — M5459 Other low back pain: Secondary | ICD-10-CM | POA: Diagnosis not present

## 2022-08-10 NOTE — Therapy (Signed)
OUTPATIENT PHYSICAL THERAPY THORACOLUMBAR EVALUATION   Patient Name: Malik Bowen MRN: [id number] DOB:08/22/1950, 72 y.o., male Today's Date: 08/10/2022  END OF SESSION:  PT End of Session - 08/10/22 1356     Visit Number 1    Number of Visits 2    Date for PT Re-Evaluation 09/03/22    Authorization Type [employer]    Progress Note Due on Visit 2    PT Start Time 1300    PT Stop Time 1343    PT Time Calculation (min) 43 min    Activity Tolerance Patient tolerated treatment well    Behavior During Therapy WFL for tasks assessed/performed             Past Medical History:  Diagnosis Date   Complication of anesthesia    Diabetes mellitus without complication (HCC)    Hyperlipidemia    Hypertension    PONV (postoperative nausea and vomiting)    Past Surgical History:  Procedure Laterality Date   CYST REMOVAL NECK  1983   had n & v   EYE SURGERY     LASIK     SHOULDER SURGERY Left 09/2021   TRANSFORAMINAL LUMBAR INTERBODY FUSION (TLIF) WITH PEDICLE SCREW FIXATION 2 LEVEL N/A 05/25/2022   Procedure: Open Lumbar Four-Lumbar Five, Lumbar Five-Sacral One Laminectomy and Transforaminal Lumber Interbody Fusion, posterior spinal fusion with removal of right side synovial cyst;  Surgeon: Jadene Pierini, MD;  Location: [facility] OR;  Service: Neurosurgery;  Laterality: N/A;   Patient Active Problem List   Diagnosis Date Noted   Spondylolisthesis of lumbar region 05/25/2022   Weak urinary stream 04/10/2020   Benign prostatic hyperplasia with urinary obstruction 04/10/2020   Osteoarthritis of carpometacarpal (CMC) joint of thumb 12/20/2018   Diabetes mellitus (HCC) 12/20/2018   Hypertensive disorder 12/20/2018   Hypercholesterolemia 12/20/2018   Carpal tunnel syndrome of left wrist 12/20/2018   Sacroiliac joint pain 05/17/2018   Arthritis of wrist 07/19/2017    PCP: Assunta Found   REFERRING PROVIDER:   Iran Sizer, PA-C    REFERRING DIAG:  Diagnosis   M43.26 (ICD-10-CM) - Fusion of spine, lumbar region    Rationale for Evaluation and Treatment: Rehabilitation  THERAPY DIAG:  Diagnosis  M43.26 (ICD-10-CM) - Fusion of spine, lumbar region    ONSET DATE: 05/25/22                                                                                                                                                                                         SUBJECTIVE STATEMENT: PT states that there is nothing that he has not been able to do but occasionally  when he is getting out of the bed wrong he has a lot of pain.  Prior to his surgery the most comfortable position he could be in is lying on his back but now it's one of the most painful.  He prays and when he goes to get up he has intense pain for a short period of time.    PERTINENT HISTORY:  72 y.o. with a history of back and RLE pain. Workup showed a right synovial cyst at 5-1 and degen spondy at L4-5 with foraminal / lateral recess / canal stenosis. Pt received  L4, L5 laminectomies, resection of synovial cyst, L4-5 TLIF, L4-5/L5-S1 posterolateral instrumented fusion on 05/25/22 and is now being referred to skilled PT.  PT is lifting 50# of concrete.  PAIN:  Are you having pain? No;  worst pain in the past week has been a 7/10;   PRECAUTIONS: None  WEIGHT BEARING RESTRICTIONS: No  FALLS:  Has patient fallen in last 6 months? No  LIVING ENVIRONMENT: Lives with: lives with their family Lives in: House/apartment Stairs: no problem    OCCUPATION: retired   PLOF: Independent  PATIENT GOALS: to have no pain   NEXT MD VISIT: July   OBJECTIVE:   PATIENT SURVEYS:  FOTO 94   MUSCLE LENGTH: Hamstrings: Right 160 deg; Left 150 deg, sitting piriformis RT 10  , Lt 30  POSTURE: rounded shoulders, forward head, decreased lumbar lordosis, and increased thoracic kyphosis    LUMBAR ROM:   AROM eval  Flexion   Extension   Right lateral flexion   Left lateral flexion   Right  rotation   Left rotation    (Blank rows = not tested)   LOWER EXTREMITY MMT:    MMT Right eval Left eval  Hip flexion 5 5  Hip extension 3+ 4  Hip abduction 5 5  Hip adduction    Hip internal rotation    Hip external rotation    Knee flexion    Knee extension 5 5  Ankle dorsiflexion 5 5  Ankle plantarflexion    Ankle inversion    Ankle eversion     (Blank rows = not tested) FUNCTIONAL TESTS:  30 seconds chair stand test:  20  Single leg stance : RT: 19", lt 24"   TODAY'S TREATMENT:                                                                                                                              DATE:  08/10/22 Exercises - Supine Single Knee to Chest Stretch  - 1 x  60 seconds  hold - Supine Hamstring Stretch  - 1 x  60 seconds  hold - Seated Piriformis Stretch  - 1 x  60 seconds  hold - Supine Transversus Abdominis Bracing - Hands on Thighs  - 10 x daily - - 10" hold - Modified Thomas Stretch  - 1 x daily  60" hold - Supine Bridge  -  10 reps - 10" hold - Sit to Stand  -10 reps    PATIENT EDUCATION:  Education details: HEP Person educated: Patient Education method: Explanation, Verbal cues, and Handouts Education comprehension: verbalized understanding and returned demonstration  HOME EXERCISE PROGRAM: Access Code: [email or web address] URL: [email or web address] Date: 08/10/2022 Prepared by: Virgina Organ  Exercises - Supine Single Knee to Chest Stretch  - 1 x daily - 7 x weekly - 1 sets - 2-3 reps - 60 seconds  hold - Supine Hamstring Stretch  - 1 x daily - 7 x weekly - 1 sets - 2-3 reps - 60 seconds  hold - Seated Piriformis Stretch  - 1 x daily - 7 x weekly - 3 sets - 2-3 reps - 60 seconds  hold - Supine Transversus Abdominis Bracing - Hands on Thighs  - 1 x daily - 7 x weekly - 3 sets - 2-3 reps - 10" hold - Modified Thomas Stretch  - 1 x daily - 7 x weekly - 3 sets - 2-3 reps - 60" hold - Supine Bridge  - 2 x daily - 7 x weekly - 1 sets -  10 reps - 10" hold - Sit to Stand  - 3 x daily - 7 x weekly - 1 sets - 10 reps  ASSESSMENT:  CLINICAL IMPRESSION: Patient is a 72 y.o. male who was seen today for physical therapy evaluation and treatment for low back pain following back surgery.  Evaluation demonstrates increased pain, decreased use of proper body mechanics, decreased strength and increased pain.  Mr. Seegars will benefit from skilled PT for a HEP and education in body mechanics.   OBJECTIVE IMPAIRMENTS: decreased strength, improper body mechanics, and pain.   ACTIVITY LIMITATIONS: bed mobility   REHAB POTENTIAL: Good  CLINICAL DECISION MAKING: Stable/uncomplicated  EVALUATION COMPLEXITY: Low   GOALS: Goals reviewed with patient? No  SHORT TERM GOALS: Target date: 09/03/2022  PT to be I in HEP in order to decrease his pain to no greater than a 3/10  Baseline: Goal status: INITIAL  2.  PT gluteal strength to be increased 1/2 grade to decrease stress on low back  Baseline:  Goal status: INITIAL  3.  Pt to have started a walking program  Baseline:  Goal status: INITIAL   PLAN:  PT FREQUENCY:  one more treatment in three weeks.   PT DURATION: 3 weeks  PLANNED INTERVENTIONS: Therapeutic exercises, Patient/Family education, and Self Care.  PLAN FOR NEXT SESSION: reassess, piriformis and hamstring length as well as gluteal strength.   Virgina Organ, PT CLT [phone]  08/10/2022, 1:58 PM

## 2022-08-30 ENCOUNTER — Ambulatory Visit (HOSPITAL_COMMUNITY): Payer: Medicare Other | Attending: Physician Assistant | Admitting: Physical Therapy

## 2022-08-30 DIAGNOSIS — M5459 Other low back pain: Secondary | ICD-10-CM | POA: Insufficient documentation

## 2022-08-30 NOTE — Therapy (Signed)
OUTPATIENT PHYSICAL THERAPY THORACOLUMBAR Discharge   Patient Name: Malik Bowen MRN: [id number] DOB:Jan 30, 1951, 72 y.o., male Today's Date: 08/30/2022 PHYSICAL THERAPY DISCHARGE SUMMARY  Visits from Start of Care: 2  Current functional level related to goals / functional outcomes: See below   Remaining deficits: Estate manager/land agent    Education / Equipment: HEP   Patient agrees to discharge. Patient goals were met. Patient is being discharged due to being pleased with the current functional level.  END OF SESSION:  PT End of Session - 08/30/22 1150    Visit Number 2    Number of Visits 2    Date for PT Re-Evaluation 09/03/22    Authorization Type [employer]    Progress Note Due on Visit 2    PT Start Time 1120    PT Stop Time 1150    PT Time Calculation (min) 30 min    Activity Tolerance Patient tolerated treatment well    Behavior During Therapy WFL for tasks assessed/performed             Past Medical History:  Diagnosis Date   Complication of anesthesia    Diabetes mellitus without complication (HCC)    Hyperlipidemia    Hypertension    PONV (postoperative nausea and vomiting)    Past Surgical History:  Procedure Laterality Date   CYST REMOVAL NECK  1983   had n & v   EYE SURGERY     LASIK     SHOULDER SURGERY Left 09/2021   TRANSFORAMINAL LUMBAR INTERBODY FUSION (TLIF) WITH PEDICLE SCREW FIXATION 2 LEVEL N/A 05/25/2022   Procedure: Open Lumbar Four-Lumbar Five, Lumbar Five-Sacral One Laminectomy and Transforaminal Lumber Interbody Fusion, posterior spinal fusion with removal of right side synovial cyst;  Surgeon: Jadene Pierini, MD;  Location: [facility] OR;  Service: Neurosurgery;  Laterality: N/A;   Patient Active Problem List   Diagnosis Date Noted   Spondylolisthesis of lumbar region 05/25/2022   Weak urinary stream 04/10/2020   Benign prostatic hyperplasia with urinary obstruction 04/10/2020   Osteoarthritis of carpometacarpal (CMC) joint of thumb  12/20/2018   Diabetes mellitus (HCC) 12/20/2018   Hypertensive disorder 12/20/2018   Hypercholesterolemia 12/20/2018   Carpal tunnel syndrome of left wrist 12/20/2018   Sacroiliac joint pain 05/17/2018   Arthritis of wrist 07/19/2017    PCP: Assunta Found   REFERRING PROVIDER:   Iran Sizer, PA-C    REFERRING DIAG:  Diagnosis  M43.26 (ICD-10-CM) - Fusion of spine, lumbar region    Rationale for Evaluation and Treatment: Rehabilitation  THERAPY DIAG:  Diagnosis  M43.26 (ICD-10-CM) - Fusion of spine, lumbar region    ONSET DATE: 05/25/22  SUBJECTIVE STATEMENT: PT states that he has been doing his exercises about 50% of the time.   He has not been walking but has been doing a lot of yard work.  PERTINENT HISTORY:  72 y.o. with a history of back and RLE pain. Workup showed a right synovial cyst at 5-1 and degen spondy at L4-5 with foraminal / lateral recess / canal stenosis. Pt received  L4, L5 laminectomies, resection of synovial cyst, L4-5 TLIF, L4-5/L5-S1 posterolateral instrumented fusion on 05/25/22 and is now being referred to skilled PT.  PT is lifting 50# of concrete.  PAIN:  Are you having pain? No;  worst pain in the past week has been a 4/10; This is getting off the floor   PRECAUTIONS: None  WEIGHT BEARING RESTRICTIONS: No  FALLS:  Has patient fallen in last 6 months? No  LIVING ENVIRONMENT: Lives with: lives with their family Lives in: House/apartment Stairs: no problem    OCCUPATION: retired   PLOF: Independent  PATIENT GOALS: to have no pain   NEXT MD VISIT: July   OBJECTIVE:   PATIENT SURVEYS:  FOTO 94   MUSCLE LENGTH: Hamstrings: Right 160 deg; Left 150 deg, sitting piriformis RT 10  , Lt 30  08/30/22:  Rt 160; Lt 160;  sitting piriformis Rt   5  , LT  20 POSTURE: rounded shoulders, forward head, decreased lumbar lordosis, and increased thoracic kyphosis    LUMBAR ROM: wfl  AROM eval  Flexion   Extension   Right lateral flexion   Left lateral flexion   Right rotation   Left rotation    (Blank rows = not tested)   LOWER EXTREMITY MMT:    MMT Right eval 08/30/22 Left eval 08/30/22  Hip flexion 5  5   Hip extension 3+ 5 4 5   Hip abduction 5  5   Hip adduction      Hip internal rotation      Hip external rotation      Knee flexion      Knee extension 5  5   Ankle dorsiflexion 5  5   Ankle plantarflexion      Ankle inversion      Ankle eversion       (Blank rows = not tested) FUNCTIONAL TESTS:  30 seconds chair stand test:  20 08/30/2022: 27  Single leg stance : RT: 19", lt 24" 08/30/22:  Rt:35"   , Lt 40"    TODAY'S TREATMENT:                                                                                                                              DATE:  08/30/22:  reassessment see above.  POE x 2 minutes Press up  Standing extension x 5  08/10/22 Exercises - Supine Single Knee to Chest Stretch  - 1 x  60 seconds  hold - Supine Hamstring Stretch  - 1 x  60 seconds  hold -  Seated Piriformis Stretch  - 1 x  60 seconds  hold - Supine Transversus Abdominis Bracing - Hands on Thighs  - 10 x daily - - 10" hold - Modified Thomas Stretch  - 1 x daily  60" hold - Supine Bridge  -  10 reps - 10" hold - Sit to Stand  -10 reps    PATIENT EDUCATION:  Education details: HEP Person educated: Patient Education method: Explanation, Verbal cues, and Handouts Education comprehension: verbalized understanding and returned demonstration  HOME EXERCISE PROGRAM: 08/30/22 Exercises - Prone on Elbows Stretch  - 1 x daily - 7 x weekly - 2 sets - 2 reps - 30 seconds  hold - Prone Press Up  - 1 x daily - 7 x weekly - 1 sets - 10 reps - 3 seconds  hold - Standing Lumbar Extension  - 1 x daily - 7 x weekly - 1 sets - 10 reps - 2-3 seconds   hold  Access Code: [email or web address] URL: [email or web address] Date: 08/10/2022 Prepared by: Virgina Organ  Exercises - Supine Single Knee to Chest Stretch  - 1 x daily - 7 x weekly - 1 sets - 2-3 reps - 60 seconds  hold - Supine Hamstring Stretch  - 1 x daily - 7 x weekly - 1 sets - 2-3 reps - 60 seconds  hold - Seated Piriformis Stretch  - 1 x daily - 7 x weekly - 3 sets - 2-3 reps - 60 seconds  hold - Supine Transversus Abdominis Bracing - Hands on Thighs  - 1 x daily - 7 x weekly - 3 sets - 2-3 reps - 10" hold - Modified Thomas Stretch  - 1 x daily - 7 x weekly - 3 sets - 2-3 reps - 60" hold - Supine Bridge  - 2 x daily - 7 x weekly - 1 sets - 10 reps - 10" hold - Sit to Stand  - 3 x daily - 7 x weekly - 1 sets - 10 reps  ASSESSMENT:  CLINICAL IMPRESSION: PT has significantly improved in his hip extension strength. His pain has decreased to a max of 4/10, balance has improved.  Therapist educated pt on the importance of body mechanics as well as not over lifting, ie just because you can does not mean you should.  Pt and therapist both agreeable to discharge.  OBJECTIVE IMPAIRMENTS: decreased strength, improper body mechanics, and pain.   ACTIVITY LIMITATIONS: bed mobility   REHAB POTENTIAL: Good  CLINICAL DECISION MAKING: Stable/uncomplicated  EVALUATION COMPLEXITY: Low   GOALS: Goals reviewed with patient? No  SHORT TERM GOALS: Target date: 09/03/2022  PT to be I in HEP in order to decrease his pain to no greater than a 3/10  Baseline: Goal status: IN PROGRESS-   2.  PT gluteal strength to be increased 1/2 grade to decrease stress on low back  Baseline:  Goal status: MET  3.  Pt to have started a walking program  Baseline:  Goal status: IN PROGRESS   PLAN:  PT FREQUENCY:  one more treatment in three weeks.   PT DURATION: 3 weeks  PLANNED INTERVENTIONS: Therapeutic exercises, Patient/Family education, and Self Care.  PLAN FOR NEXT  SESSION:Discharge.  Virgina Organ, PT CLT [phone]  08/30/2022, 11:56 AM

## 2022-10-11 DIAGNOSIS — E119 Type 2 diabetes mellitus without complications: Secondary | ICD-10-CM | POA: Diagnosis not present

## 2022-10-11 DIAGNOSIS — I10 Essential (primary) hypertension: Secondary | ICD-10-CM | POA: Diagnosis not present

## 2022-10-11 DIAGNOSIS — E7849 Other hyperlipidemia: Secondary | ICD-10-CM | POA: Diagnosis not present

## 2022-10-11 DIAGNOSIS — Z23 Encounter for immunization: Secondary | ICD-10-CM | POA: Diagnosis not present

## 2022-10-11 DIAGNOSIS — E1159 Type 2 diabetes mellitus with other circulatory complications: Secondary | ICD-10-CM | POA: Diagnosis not present

## 2022-12-01 DIAGNOSIS — M4326 Fusion of spine, lumbar region: Secondary | ICD-10-CM | POA: Diagnosis not present

## 2022-12-14 ENCOUNTER — Encounter: Payer: Self-pay | Admitting: Internal Medicine

## 2022-12-14 ENCOUNTER — Ambulatory Visit: Payer: Medicare Other | Attending: Internal Medicine | Admitting: Internal Medicine

## 2022-12-14 VITALS — BP 126/74 | HR 77 | Ht 71.0 in | Wt 240.6 lb

## 2022-12-14 DIAGNOSIS — E7800 Pure hypercholesterolemia, unspecified: Secondary | ICD-10-CM | POA: Diagnosis not present

## 2022-12-14 NOTE — Progress Notes (Signed)
'   Cardiology Office Note   Date:  12/14/2022   ID:  Malik Bowen, DOB [birth year], MRN [id number]  PCP:  Assunta Found, MD  Cardiologist:   Dietrich Pates, MD    Pt presents for follow up of HTN and CAD      History of Present Illness: Malik Bowen is a 72 y.o. male with a history of HTN, HL CAD (CCTA in 2021 Ca score 17 with minimal CAD )   ALos hx of palpitations   , tachycardia   and CP   CCTA in 2021:  Ca score of 17   No signifiant CAD on angiogram    Feb 2023  PT complained of low BP and HRs in 130s  Zio patch showed no arrhthmias     I saw the pt in March 2023   Since then he denies CP   Breathing is good   Denies dizziness BP in 120s/No palptations    PT says he is riding ebike a lot    Diet    Protein shake  Powder from [employer] mild and peaches Snack  APples and carrots Lunch  salad with chicken, veggies Dinner     Teriyaki  beef with a little  Current Meds  Medication Sig   atorvastatin (LIPITOR) 40 MG tablet Take 40 mg by mouth every evening.   celecoxib (CELEBREX) 200 MG capsule Take 1 capsule (200 mg total) by mouth in the morning.   Coenzyme Q10 (COQ10) 200 MG CAPS Take 200 mg by mouth at bedtime.   Flaxseed, Linseed, (FLAXSEED OIL) 1400 MG CAPS Take 1 capsule by mouth at bedtime.   hydrochlorothiazide (HYDRODIURIL) 25 MG tablet Take 25 mg by mouth in the morning.   metFORMIN (GLUCOPHAGE) 500 MG tablet Take 500 mg by mouth 2 (two) times daily with a meal.   Misc Natural Products (PROSTATE HEALTH) CAPS Take 1 capsule by mouth in the morning and at bedtime. Saw Palmetto with Pygeum and Nettle Root   olmesartan (BENICAR) 20 MG tablet TAKE 1 TABLET BY MOUTH DAILY     Allergies:   Other   Past Medical History:  Diagnosis Date   Complication of anesthesia    Diabetes mellitus without complication (HCC)    Hyperlipidemia    Hypertension    PONV (postoperative nausea and vomiting)     Past Surgical History:  Procedure Laterality Date   CYST  REMOVAL NECK  1983   had n & v   EYE SURGERY     LASIK     SHOULDER SURGERY Left 09/2021   TRANSFORAMINAL LUMBAR INTERBODY FUSION (TLIF) WITH PEDICLE SCREW FIXATION 2 LEVEL N/A 05/25/2022   Procedure: Open Lumbar Four-Lumbar Five, Lumbar Five-Sacral One Laminectomy and Transforaminal Lumber Interbody Fusion, posterior spinal fusion with removal of right side synovial cyst;  Surgeon: Jadene Pierini, MD;  Location: [facility] OR;  Service: Neurosurgery;  Laterality: N/A;     Social History:  The patient  reports that he has never smoked. He has never used smokeless tobacco. He reports that he does not drink alcohol and does not use drugs.   Family History:  The patient's family history includes Heart failure in his brother and father; Kidney failure in his brother; Prostate cancer in his brother.    ROS:  Please see the history of present illness. All other systems are reviewed and  Negative to the above problem except as noted.    PHYSICAL EXAM: VS:  BP 126/74 (  BP Location: Left Arm, Patient Position: Sitting, Cuff Size: Normal)   Pulse 77   Ht 5\' 11"  (1.803 m)   Wt 240 lb 9.6 oz (109.1 kg)   SpO2 97%   BMI 33.56 kg/m   GEN:  Obese 72 yo in no acute distress  HEENT: normal  Neck: No JVD  Cardiac: RRR; no murmurs   Tr    LE edema  Respiratory:  clear to auscultation  GI: soft, nontender, No hepatomegaly    EKG:  EKG is not ordered   CT coronary angiogram   10/03/19  FINDINGS: Coronary calcium score: The patient's coronary artery calcium score is 17, which places the patient in the 47th percentile.   Coronary arteries: Normal coronary origins.  Right dominance.   Right Coronary Artery: Large caliber vessel, gives rise to PDA. No significant plaque or stenosis.   Left Main Coronary Artery: Normal caliber vessel. No significant plaque or stenosis.   Left Anterior Descending Coronary Artery: Normal caliber vessel. Small amounts of focal mixed calcified and noncalcified plaque  in proximal and mid LAD with maximum 1-24% stenosis. Gives rise to 2 diagonal branches.   Left Circumflex Artery: Small caliber vessel with short course. No significant plaque or stenosis. Gives rise to 1 small OM branches.   Aorta: Borderline enlarged size, 37 mm at the mid ascending aorta (level of the PA bifurcation) measured double oblique. Scattered diffuse calcifications. No dissection.   Aortic Valve: Annular calcifications. Trileaflet.   Other findings:   Normal pulmonary vein drainage into the left atrium.   Normal left atrial appendage without a thrombus.   Normal size of the pulmonary artery.   IMPRESSION: 1.  Minimal nonobstructive CAD, CADRADS = 1.   2. Coronary calcium score of 17. This was 47th percentile for age and sex matched control.   3.  Normal coronary origin with right dominance.   4.  Aortic atherosclerosis.     Electronically Signed   By: Jodelle Red M.D.   On: 10/04/2019 17:35 Lipid Panel No results found for: "CHOL", "TRIG", "HDL", "CHOLHDL", "VLDL", "LDLCALC", "LDLDIRECT"    Wt Readings from Last 3 Encounters:  12/14/22 240 lb 9.6 oz (109.1 kg)  05/25/22 225 lb (102.1 kg)  05/19/22 231 lb (104.8 kg)      ASSESSMENT AND PLAN:  1  CAD   No symptoms of angina  2  Hx of palpitations   No arrhythmias documented   No symptoms   3  HTN  BP is controlled  Denies dizziness     3  HLContinue statin   Check lipomed   4  DM  Last A1C was 6.2   Has been better   Reviewed diet     Follow up in clinc in 1 year   Current medicines are reviewed at length with the patient today.  The patient does not have concerns regarding medicines.  Signed, Dietrich Pates, MD  12/14/2022 2:35 PM    [facility] [address] [place], [place], [place]  [address] Phone: [phone]; Fax: [phone]

## 2022-12-14 NOTE — Patient Instructions (Signed)
Medication Instructions:  Your physician recommends that you continue on your current medications as directed. Please refer to the Current Medication list given to you today.  *If you need a refill on your cardiac medications before your next appointment, please call your pharmacy*   Lab Work: Your physician recommends that you return for lab work at next lab draw by PCP.    If you have labs (blood work) drawn today and your tests are completely normal, you will receive your results only by: MyChart Message (if you have MyChart) OR A paper copy in the mail If you have any lab test that is abnormal or we need to change your treatment, we will call you to review the results.   Testing/Procedures: NONE    Follow-Up: At [facility], you and your health needs are our priority.  As part of our continuing mission to provide you with exceptional heart care, we have created designated Provider Care Teams.  These Care Teams include your primary Cardiologist (physician) and Advanced Practice Providers (APPs -  Physician Assistants and Nurse Practitioners) who all work together to provide you with the care you need, when you need it.  We recommend signing up for the patient portal called "MyChart".  Sign up information is provided on this After Visit Summary.  MyChart is used to connect with patients for Virtual Visits (Telemedicine).  Patients are able to view lab/test results, encounter notes, upcoming appointments, etc.  Non-urgent messages can be sent to your provider as well.   To learn more about what you can do with MyChart, go to [email or web address].    Your next appointment:   1 year(s)  Provider:   You may see Dietrich Pates, MD or one of the following Advanced Practice Providers on your designated Care Team:   Randall An, PA-C  Jacolyn Reedy, PA-C     Other Instructions Thank you for choosing [facility]!

## 2023-03-02 DIAGNOSIS — M4326 Fusion of spine, lumbar region: Secondary | ICD-10-CM | POA: Diagnosis not present

## 2023-03-08 DIAGNOSIS — E1159 Type 2 diabetes mellitus with other circulatory complications: Secondary | ICD-10-CM | POA: Diagnosis not present

## 2023-03-08 DIAGNOSIS — I10 Essential (primary) hypertension: Secondary | ICD-10-CM | POA: Diagnosis not present

## 2023-03-08 DIAGNOSIS — E7849 Other hyperlipidemia: Secondary | ICD-10-CM | POA: Diagnosis not present

## 2023-03-08 DIAGNOSIS — E782 Mixed hyperlipidemia: Secondary | ICD-10-CM | POA: Diagnosis not present

## 2023-04-22 DIAGNOSIS — E1159 Type 2 diabetes mellitus with other circulatory complications: Secondary | ICD-10-CM | POA: Diagnosis not present

## 2023-05-05 DIAGNOSIS — L720 Epidermal cyst: Secondary | ICD-10-CM | POA: Diagnosis not present

## 2023-05-05 DIAGNOSIS — L821 Other seborrheic keratosis: Secondary | ICD-10-CM | POA: Diagnosis not present

## 2023-05-05 DIAGNOSIS — D225 Melanocytic nevi of trunk: Secondary | ICD-10-CM | POA: Diagnosis not present

## 2023-05-25 DIAGNOSIS — D690 Allergic purpura: Secondary | ICD-10-CM | POA: Diagnosis not present

## 2023-05-25 DIAGNOSIS — G4733 Obstructive sleep apnea (adult) (pediatric): Secondary | ICD-10-CM | POA: Diagnosis not present

## 2023-05-25 DIAGNOSIS — E1159 Type 2 diabetes mellitus with other circulatory complications: Secondary | ICD-10-CM | POA: Diagnosis not present

## 2023-05-25 DIAGNOSIS — E782 Mixed hyperlipidemia: Secondary | ICD-10-CM | POA: Diagnosis not present

## 2023-05-25 DIAGNOSIS — Z0001 Encounter for general adult medical examination with abnormal findings: Secondary | ICD-10-CM | POA: Diagnosis not present

## 2023-05-26 ENCOUNTER — Other Ambulatory Visit (HOSPITAL_COMMUNITY): Payer: Self-pay | Admitting: Family Medicine

## 2023-05-26 DIAGNOSIS — D690 Allergic purpura: Secondary | ICD-10-CM

## 2023-05-26 DIAGNOSIS — E1159 Type 2 diabetes mellitus with other circulatory complications: Secondary | ICD-10-CM

## 2023-05-31 ENCOUNTER — Encounter (HOSPITAL_COMMUNITY): Payer: Self-pay

## 2023-05-31 ENCOUNTER — Ambulatory Visit (HOSPITAL_COMMUNITY): Payer: Medicare Other

## 2023-06-06 ENCOUNTER — Ambulatory Visit (HOSPITAL_COMMUNITY)
Admission: RE | Admit: 2023-06-06 | Discharge: 2023-06-06 | Disposition: A | Discharge status: Home / Self Care | Source: Ambulatory Visit | Attending: Family Medicine | Admitting: Family Medicine

## 2023-06-06 DIAGNOSIS — D690 Allergic purpura: Secondary | ICD-10-CM | POA: Diagnosis not present

## 2023-06-06 DIAGNOSIS — E1159 Type 2 diabetes mellitus with other circulatory complications: Secondary | ICD-10-CM | POA: Diagnosis not present

## 2023-06-06 DIAGNOSIS — R200 Anesthesia of skin: Secondary | ICD-10-CM | POA: Diagnosis not present

## 2023-06-06 DIAGNOSIS — I10 Essential (primary) hypertension: Secondary | ICD-10-CM | POA: Diagnosis not present

## 2023-06-06 DIAGNOSIS — E119 Type 2 diabetes mellitus without complications: Secondary | ICD-10-CM | POA: Diagnosis not present

## 2023-07-27 NOTE — Progress Notes (Signed)
 [facility] - Clinic Note  08/08/2023     CHIEF COMPLAINT Patient presents for Retina Follow Up   HISTORY OF PRESENT ILLNESS: Malik Bowen is a 73 y.o. male who presents to the clinic today for:   HPI     Retina Follow Up   Patient presents with  Diabetic Retinopathy.  In both eyes.  This started 1 year ago.  Duration of 1 year.  Since onset it is stable.  I, the attending physician,  performed the HPI with the patient and updated documentation appropriately.        Comments   Patient feels the vision gets blurry at times. He is not using eye drops. His blood sugar was 110.       Last edited by Ronelle Coffee, MD on 08/10/2023 12:30 AM.    Pt states his A1c was 6.2 about 6 months ago, he has been taking Mounjaro  and has lost about 30lbs  Referring physician: Minus Amel, MD [address] [place],  [place] [address]  HISTORICAL INFORMATION:   Selected notes from the MEDICAL RECORD NUMBER Self referred for DM exam   CURRENT MEDICATIONS: No current outpatient medications on file. (Ophthalmic Drugs)   No current facility-administered medications for this visit. (Ophthalmic Drugs)   Current Outpatient Medications (Other)  Medication Sig   atorvastatin  (LIPITOR) 40 MG tablet Take 40 mg by mouth every evening.   celecoxib (CELEBREX) 200 MG capsule Take 1 capsule (200 mg total) by mouth in the morning.   Coenzyme Q10 (COQ10) 200 MG CAPS Take 200 mg by mouth at bedtime.   cyclobenzaprine  (FLEXERIL ) 10 MG tablet Take 1 tablet (10 mg total) by mouth 3 (three) times daily as needed for muscle spasms. (Patient not taking: Reported on 12/14/2022)   Flaxseed, Linseed, (FLAXSEED OIL) 1400 MG CAPS Take 1 capsule by mouth at bedtime.   hydrochlorothiazide  (HYDRODIURIL ) 25 MG tablet Take 25 mg by mouth in the morning.   metFORMIN  (GLUCOPHAGE ) 500 MG tablet Take 500 mg by mouth 2 (two) times daily with a meal.   Misc Natural Products (PROSTATE HEALTH) CAPS  Take 1 capsule by mouth in the morning and at bedtime. Saw Palmetto with Pygeum and Nettle Root   olmesartan  (BENICAR ) 20 MG tablet TAKE 1 TABLET BY MOUTH DAILY   oxyCODONE  (OXY IR/ROXICODONE ) 5 MG immediate release tablet Take 1 tablet (5 mg total) by mouth every 4 (four) hours as needed for severe pain ((score 4 to 6)). (Patient not taking: Reported on 12/14/2022)   No current facility-administered medications for this visit. (Other)   REVIEW OF SYSTEMS: ROS   Positive for: Musculoskeletal, Endocrine, Eyes Negative for: Constitutional, Gastrointestinal, Neurological, Skin, Genitourinary, HENT, Cardiovascular, Respiratory, Psychiatric, Allergic/Imm, Heme/Lymph Last edited by Olene Berne, COT on 08/08/2023  8:27 AM.      ALLERGIES Allergies  Allergen Reactions   Other     Walnuts and strawberrys cause mouth sores   PAST MEDICAL HISTORY Past Medical History:  Diagnosis Date   Complication of anesthesia    Diabetes mellitus without complication (HCC)    Hyperlipidemia    Hypertension    PONV (postoperative nausea and vomiting)    Past Surgical History:  Procedure Laterality Date   CYST REMOVAL NECK  1983   had n & v   EYE SURGERY     LASIK     SHOULDER SURGERY Left 09/2021   TRANSFORAMINAL LUMBAR INTERBODY FUSION (TLIF) WITH PEDICLE SCREW FIXATION 2 LEVEL N/A 05/25/2022  Procedure: Open Lumbar Four-Lumbar Five, Lumbar Five-Sacral One Laminectomy and Transforaminal Lumber Interbody Fusion, posterior spinal fusion with removal of right side synovial cyst;  Surgeon: Cannon Champion, MD;  Location: [facility] OR;  Service: Neurosurgery;  Laterality: N/A;   FAMILY HISTORY Family History  Problem Relation Age of Onset   Heart failure Father    Heart failure Brother    Prostate cancer Brother    Kidney failure Brother    SOCIAL HISTORY Social History   Tobacco Use   Smoking status: Never   Smokeless tobacco: Never  Vaping Use   Vaping status: Never Used  Substance Use  Topics   Alcohol use: No   Drug use: No       OPHTHALMIC EXAM:  Base Eye Exam     Visual Acuity (Snellen - Linear)       Right Left   Dist [place] 20/25 20/20         Tonometry (Tonopen, 8:30 AM)       Right Left   Pressure 12 13         Pupils       Dark Light Shape React APD   Right 3 2 Round Brisk None   Left 3 2 Round Brisk None         Visual Fields       Left Right    Full Full         Extraocular Movement       Right Left    Full, Ortho Full, Ortho         Neuro/Psych     Oriented x3: Yes   Mood/Affect: Normal         Dilation     Both eyes: 1.0% Mydriacyl, 2.5% Phenylephrine  @ 8:28 AM           Slit Lamp and Fundus Exam     Slit Lamp Exam       Right Left   Lids/Lashes Dermatochalasis - upper lid, mild MGD Dermatochalasis - upper lid, Meibomian gland dysfunction   Conjunctiva/Sclera White and quiet White and quiet   Cornea Arcus, trace tear film debris, well healed lasik flap, 2-3+ Guttata Arcus, trace tear film debris, well healed lasik flap, 2+ Guttata   Anterior Chamber deep and clear deep and clear   Iris Round and dilated, No NVI Round and dilated, No NVI   Lens 2-3+ Nuclear sclerosis with brunescence, 2-3+ Cortical cataract, 1+ Posterior subcapsular cataract 2-3+ Nuclear sclerosis, 2-3+ Cortical cataract   Anterior Vitreous Vitreous syneresis, Posterior vitreous detachment, vitreous condesations Vitreous syneresis, Posterior vitreous detachment, vitreous condensations         Fundus Exam       Right Left   Disc pink and sharp, Tilted disc, mild temporal Peripapillary atrophy pink and sharp, Tilted disc, Temporal Peripapillary atrophy and pigment   C/D Ratio 0.4 0.4   Macula Flat, good foveal reflex, mild Retinal pigment epithelial mottling, No heme or edema Flat, good foveal reflex, mild Retinal pigment epithelial mottling, No heme or edema   Vessels attenuated, mild tortuosity mild attenuation, mild tortuosity    Periphery Attached, mild paving stone inferiorly, mild pigmented Chorioretinal scar at 0730, no heme Attached, mild peripheral CR atrophy temporally, no heme            IMAGING AND PROCEDURES  Imaging and Procedures for @TODAY @  OCT, Retina - OU - Both Eyes        Right Eye Quality was good. Central Foveal  Thickness: 290. Progression has been stable. Findings include normal foveal contour, no IRF, no SRF, epiretinal membrane (Trace ERM and pucker).   Left Eye Quality was good. Central Foveal Thickness: 285. Progression has been stable. Findings include normal foveal contour, no IRF, no SRF (Trace ERM superior macula).   Notes  *Images captured and stored on drive  Diagnosis / Impression:  No DME OU OD: trace ERM and pucker OS: Trace ERM superior macula  Clinical management:  See below  Abbreviations: NFP - Normal foveal profile. CME - cystoid macular edema. PED - pigment epithelial detachment. IRF - intraretinal fluid. SRF - subretinal fluid. EZ - ellipsoid zone. ERM - epiretinal membrane. ORA - outer retinal atrophy. ORT - outer retinal tubulation. SRHM - subretinal hyper-reflective material            ASSESSMENT/PLAN:    ICD-10-CM   1. Diabetes mellitus type 2 without retinopathy (HCC)  E11.9 OCT, Retina - OU - Both Eyes    2. Long term (current) use of oral hypoglycemic drugs  Z79.84     3. Long-term (current) use of injectable non-insulin  antidiabetic drugs  Z79.85     4. Posterior vitreous detachment of both eyes  H43.813     5. Epiretinal membrane (ERM) of right eye  H35.371     6. Hx of LASIK  Z98.890     7. Combined forms of age-related cataract of both eyes  H25.813      1-3. Diabetes mellitus, type 2 without retinopathy  - last A1c; 6.2 about 6 months ago per pt report  - The incidence, risk factors for progression, natural history and treatment options for diabetic retinopathy  were discussed with patient.    - The need for close monitoring of  blood glucose, blood pressure, and serum lipids, avoiding cigarette or any type of tobacco, and the need for long term follow up was also discussed with patient.  - f/u in 1 year, sooner prn  4. PVD / vitreous syneresis  - prominent vitreous condensations  - stable   - Discussed findings and prognosis  - No RT or RD on 360 peripheral exam  - Reviewed s/s of RT/RD  - Strict return precautions for any such RT/RD sighs/symptoms   5. Epiretinal membrane, OU  - OCT shows tr ERM and pucker -- stable from last year - BCVA OD 20/25, OS 20/20 - asymptomatic, no metamorphopsia - no indication for surgery at this time - monitor for now - f/u 1 year -- DFE/OCT  6. Hx of Lasik OU-   - monitor  7. Combined forma age-related cataracts OU-   - The symptoms of cataract, surgical options, and treatments and risks were discussed with patient.  - discussed diagnosis and progression  - will refer to Dr. Kirkland Peppers for consult  Ophthalmic Meds Ordered this visit:  No orders of the defined types were placed in this encounter.    Return in about 1 year (around 08/07/2024) for f/u DM exam, DFE, OCT.  There are no Patient Instructions on file for this visit.  This document serves as a record of services personally performed by Jeanice Millard, MD, PhD. It was created on their behalf by Morley Arabia. Bevin Bucks, OA an ophthalmic technician. The creation of this record is the provider's dictation and/or activities during the visit.    Electronically signed by: Morley Arabia. Bevin Bucks, OA 08/10/23 12:30 AM  Jeanice Millard, M.D., Ph.D. Diseases & Surgery of the Retina and Vitreous [facility]  I have reviewed the above documentation for accuracy and completeness, and I agree with the above. Jeanice Millard, M.D., Ph.D. 08/10/23 12:31 AM   Abbreviations: M myopia (nearsighted); A astigmatism; H hyperopia (farsighted); P presbyopia; Mrx spectacle prescription;  CTL contact lenses; OD right eye;  OS left eye; OU both eyes  XT exotropia; ET esotropia; PEK punctate epithelial keratitis; PEE punctate epithelial erosions; DES dry eye syndrome; MGD meibomian gland dysfunction; ATs artificial tears; PFAT's preservative free artificial tears; NSC nuclear sclerotic cataract; PSC posterior subcapsular cataract; ERM epi-retinal membrane; PVD posterior vitreous detachment; RD retinal detachment; DM diabetes mellitus; DR diabetic retinopathy; NPDR non-proliferative diabetic retinopathy; PDR proliferative diabetic retinopathy; CSME clinically significant macular edema; DME diabetic macular edema; dbh dot blot hemorrhages; CWS cotton wool spot; POAG primary open angle glaucoma; C/D cup-to-disc ratio; HVF humphrey visual field; GVF goldmann visual field; OCT optical coherence tomography; IOP intraocular pressure; BRVO Branch retinal vein occlusion; CRVO central retinal vein occlusion; CRAO central retinal artery occlusion; BRAO branch retinal artery occlusion; RT retinal tear; SB scleral buckle; PPV pars plana vitrectomy; VH Vitreous hemorrhage; PRP panretinal laser photocoagulation; IVK intravitreal kenalog ; VMT vitreomacular traction; MH Macular hole;  NVD neovascularization of the disc; NVE neovascularization elsewhere; AREDS age related eye disease study; ARMD age related macular degeneration; POAG primary open angle glaucoma; EBMD epithelial/anterior basement membrane dystrophy; ACIOL anterior chamber intraocular lens; IOL intraocular lens; PCIOL posterior chamber intraocular lens; Phaco/IOL phacoemulsification with intraocular lens placement; PRK photorefractive keratectomy; LASIK laser assisted in situ keratomileusis; HTN hypertension; DM diabetes mellitus; COPD chronic obstructive pulmonary disease

## 2023-08-08 ENCOUNTER — Ambulatory Visit (INDEPENDENT_AMBULATORY_CARE_PROVIDER_SITE_OTHER): Payer: Medicare Other | Admitting: Ophthalmology

## 2023-08-08 DIAGNOSIS — H43813 Vitreous degeneration, bilateral: Secondary | ICD-10-CM | POA: Diagnosis not present

## 2023-08-08 DIAGNOSIS — E119 Type 2 diabetes mellitus without complications: Secondary | ICD-10-CM | POA: Diagnosis not present

## 2023-08-08 DIAGNOSIS — Z7984 Long term (current) use of oral hypoglycemic drugs: Secondary | ICD-10-CM

## 2023-08-08 DIAGNOSIS — H25813 Combined forms of age-related cataract, bilateral: Secondary | ICD-10-CM

## 2023-08-08 DIAGNOSIS — H35373 Puckering of macula, bilateral: Secondary | ICD-10-CM | POA: Diagnosis not present

## 2023-08-08 DIAGNOSIS — H35371 Puckering of macula, right eye: Secondary | ICD-10-CM

## 2023-08-08 DIAGNOSIS — Z9889 Other specified postprocedural states: Secondary | ICD-10-CM | POA: Diagnosis not present

## 2023-08-08 DIAGNOSIS — Z7985 Long-term (current) use of injectable non-insulin antidiabetic drugs: Secondary | ICD-10-CM | POA: Diagnosis not present

## 2023-08-10 ENCOUNTER — Encounter (INDEPENDENT_AMBULATORY_CARE_PROVIDER_SITE_OTHER): Payer: Self-pay | Admitting: Ophthalmology

## 2023-09-07 DIAGNOSIS — H18413 Arcus senilis, bilateral: Secondary | ICD-10-CM | POA: Diagnosis not present

## 2023-09-07 DIAGNOSIS — H2513 Age-related nuclear cataract, bilateral: Secondary | ICD-10-CM | POA: Diagnosis not present

## 2023-09-07 DIAGNOSIS — H35373 Puckering of macula, bilateral: Secondary | ICD-10-CM | POA: Diagnosis not present

## 2023-09-07 DIAGNOSIS — H25043 Posterior subcapsular polar age-related cataract, bilateral: Secondary | ICD-10-CM | POA: Diagnosis not present

## 2023-09-07 DIAGNOSIS — H2512 Age-related nuclear cataract, left eye: Secondary | ICD-10-CM | POA: Diagnosis not present

## 2023-09-23 DIAGNOSIS — Z7689 Persons encountering health services in other specified circumstances: Secondary | ICD-10-CM | POA: Diagnosis not present

## 2023-09-23 DIAGNOSIS — E785 Hyperlipidemia, unspecified: Secondary | ICD-10-CM | POA: Diagnosis not present

## 2023-09-23 DIAGNOSIS — E119 Type 2 diabetes mellitus without complications: Secondary | ICD-10-CM | POA: Diagnosis not present

## 2023-09-23 DIAGNOSIS — I10 Essential (primary) hypertension: Secondary | ICD-10-CM | POA: Diagnosis not present

## 2023-10-17 DIAGNOSIS — E785 Hyperlipidemia, unspecified: Secondary | ICD-10-CM | POA: Diagnosis not present

## 2023-10-17 DIAGNOSIS — I10 Essential (primary) hypertension: Secondary | ICD-10-CM | POA: Diagnosis not present

## 2023-10-17 DIAGNOSIS — Z7689 Persons encountering health services in other specified circumstances: Secondary | ICD-10-CM | POA: Diagnosis not present

## 2023-10-17 DIAGNOSIS — E119 Type 2 diabetes mellitus without complications: Secondary | ICD-10-CM | POA: Diagnosis not present

## 2023-10-21 DIAGNOSIS — Z79899 Other long term (current) drug therapy: Secondary | ICD-10-CM | POA: Diagnosis not present

## 2023-10-21 DIAGNOSIS — E1169 Type 2 diabetes mellitus with other specified complication: Secondary | ICD-10-CM | POA: Diagnosis not present

## 2023-10-21 DIAGNOSIS — E785 Hyperlipidemia, unspecified: Secondary | ICD-10-CM | POA: Diagnosis not present

## 2023-10-21 DIAGNOSIS — Z794 Long term (current) use of insulin: Secondary | ICD-10-CM | POA: Diagnosis not present

## 2023-10-21 DIAGNOSIS — I10 Essential (primary) hypertension: Secondary | ICD-10-CM | POA: Diagnosis not present

## 2023-10-21 DIAGNOSIS — Z0000 Encounter for general adult medical examination without abnormal findings: Secondary | ICD-10-CM | POA: Diagnosis not present

## 2023-10-27 DIAGNOSIS — H6123 Impacted cerumen, bilateral: Secondary | ICD-10-CM | POA: Diagnosis not present

## 2023-11-01 DIAGNOSIS — H25041 Posterior subcapsular polar age-related cataract, right eye: Secondary | ICD-10-CM | POA: Diagnosis not present

## 2023-11-01 DIAGNOSIS — H2512 Age-related nuclear cataract, left eye: Secondary | ICD-10-CM | POA: Diagnosis not present

## 2023-11-01 DIAGNOSIS — I10 Essential (primary) hypertension: Secondary | ICD-10-CM | POA: Diagnosis not present

## 2023-11-01 DIAGNOSIS — H268 Other specified cataract: Secondary | ICD-10-CM | POA: Diagnosis not present

## 2023-11-01 DIAGNOSIS — H2511 Age-related nuclear cataract, right eye: Secondary | ICD-10-CM | POA: Diagnosis not present

## 2023-11-21 DIAGNOSIS — M79642 Pain in left hand: Secondary | ICD-10-CM | POA: Diagnosis not present

## 2023-11-21 DIAGNOSIS — M79641 Pain in right hand: Secondary | ICD-10-CM | POA: Diagnosis not present

## 2024-01-19 ENCOUNTER — Ambulatory Visit

## 2024-01-19 VITALS — BP 114/73 | HR 61 | Temp 97.5°F | Resp 12 | Ht 70.0 in | Wt 218.0 lb

## 2024-01-19 DIAGNOSIS — M1A9XX1 Chronic gout, unspecified, with tophus (tophi): Secondary | ICD-10-CM

## 2024-01-19 DIAGNOSIS — M2550 Pain in unspecified joint: Secondary | ICD-10-CM | POA: Diagnosis not present

## 2024-01-19 NOTE — Progress Notes (Unsigned)
 Cardiology Office Note    Date:  01/20/2024  ID:  Malik Bowen 08/02/1950, MRN [id number] Cardiologist: Vina Gull, MD { :  History of Present Illness:    Malik Bowen is a 73 y.o. male with past medical history of CAD (prior Coronary CTA in 09/2019 showed minimal, nonobstructive CAD with calcium  score at 17), HTN, HLD and palpitations who presents to the office today for annual follow-up.  He was last examined by Dr. Gull in 11/2022 and denied any recent chest pain or dyspnea on exertion at that time. Was riding his e-bike for exercise. No changes were made to his cardiac medications and he was continued on Atorvastatin  40 mg daily, HCTZ 25 mg daily and Olmesartan  20 mg daily.  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 He also exercises at home. He denies any recent chest pain or dyspnea on exertion. Does report episodic dizziness and has been keeping a blood pressure log at home. SBP has been in the low 100's and drops into the 80's at times. He denies any orthopnea, PND or pitting edema. Consumes occasional caffeinated beverages but also consumes water. No alcohol intake.  Studies Reviewed:   EKG: EKG is ordered today and demonstrates:   EKG Interpretation Date/Time:  Friday January 20 2024 14:16:24 EDT Ventricular Rate:  81 PR Interval:  196 QRS Duration:  102 QT Interval:  358 QTC Calculation: 415 R Axis:   39  Text Interpretation: Sinus rhythm with occasional Premature ventricular complexes TWI along inferior leads which has been noted on prior tracings. Confirmed by [name] ([id number]) on 01/20/2024 2:18:53 PM       Coronary CTA: 09/2019 IMPRESSION: 1.  Minimal nonobstructive CAD, CADRADS = 1.   2. Coronary calcium  score of 17. This was 47th percentile for age and sex matched  control.   3.  Normal coronary origin with right dominance.   4.  Aortic atherosclerosis.    Event Monitor: 04/2021 Patch Wear Time:  3 days and 2 hours (2023-02-16T17:29:18-0500 to 2023-02-19T20:24:49-0500)   Sinus rhythm   Rates 43 to 126 bpm  Average HR 73 bpm    Rare PVC, PAC.   Short burst SVT (7 beats at 141 bpm) Triggered events correlated with SR     Physical Exam:   VS:  BP 100/60 (BP Location: Right Arm, Cuff Size: Normal)   Pulse 91   Ht 5' 10 (1.778 m)   Wt 214 lb (97.1 kg) Comment: Pt states  SpO2 93%   BMI 30.71 kg/m    Wt Readings from Last 3 Encounters:  01/20/24 214 lb (97.1 kg)  01/19/24 218 lb (98.9 kg)  12/14/22 240 lb 9.6 oz (109.1 kg)     GEN: Pleasant male appearing in no acute distress NECK: No JVD; No carotid bruits CARDIAC: RRR, no murmurs, rubs, gallops RESPIRATORY:  Clear to auscultation without rales, wheezing or rhonchi  ABDOMEN: Appears non-distended. No obvious abdominal masses. EXTREMITIES: No clubbing or cyanosis. No pitting edema.  Distal pedal pulses are 2+ bilaterally.   Assessment and Plan:   1. Coronary artery calcification seen on CT scan - Prior Coronary CTA in 09/2019 showed minimal, nonobstructive CAD with a calcium  score of 17.  - He remains very active at baseline and denies any recent anginal symptoms. Continue with risk factor modification.  2. Hypercholesterolemia - Followed by his PCP. FLP in 09/2023 showed his LDL was at 66. Continue current medical therapy with Atorvastatin  40 mg daily.  3. HTN - BP is soft at 100/60 during today's visit. He does report episodic dizziness as outlined above and suspect this is due to episodes of hypotension in the setting of weight loss through dietary changes, increased activity and being on Mounjaro . - Will continue Olmesartan  10 mg daily but reduce HCTZ to 12.5 mg daily. Will have him keep a BP log and return this in a month. If BP remains well-controlled, would stop HCTZ. Prefer to  continue Olmesartan  given his history of Type II DM but may need to reduce to 5mg  daily pending BP trend. Creatinine was stable at 1.05 when checked in 09/2023 by review of [facility] DXA.   4. History of Palpitations - Prior monitor in 04/2021 showed predominantly normal sinus rhythm with a brief burst of SVT for 7 beats and rare PAC's and PVC's. He does have an isolated PVC on his EKG today but is asymptomatic. Denies any recent palpitations. No indication for medical therapy at this time.   Signed, Laymon CHRISTELLA Qua, PA-C

## 2024-01-19 NOTE — Progress Notes (Addendum)
 Office Visit Note  Patient: Malik Bowen             Date of Birth: [birth year]           MRN: [id number]             PCP: Shona Norleen PEDLAR, MD Referring: Beverley Evalene BIRCH, MD Visit Date: 01/19/2024 Occupation: Data Unavailable  Subjective:  New Patient (Initial Visit) (Pain and swelling in hands.)  Discussed the use of AI scribe software for clinical note transcription with the patient, who gave verbal consent to proceed.  History of Present Illness Malik Bowen is a 73 year old male who presents with worsening hand stiffness and nodules.  He has experienced hand pain since 2012, initially affecting the thumb joints, which prompted him to seek hand therapy. Over the past 15 years, he has noticed progressive stiffness in his fingers, particularly in the left hand, causing difficulty with daily tasks such as holding a toothbrush and signing documents.  In the last year to year and a half, he has developed nodules on his fingers and increased difficulty bending them, necessitating the removal of his ring due to swelling. The pain and stiffness have progressively worsened, with aching in the knuckles and an inability to make a fist.  He attends hand therapy sessions, including exercises and hot corn husk therapy, to maintain finger movement. Despite these efforts, he experiences persistent stiffness throughout the day.  He was told by a doctor that he had markers suggesting he had Lyme disease for a while, and he was treated with doxycycline  after significant leg swelling in 2018. He also underwent back surgery for fusion of S1, L5, and L4 about a year and a half ago, and he feels that his hand symptoms started progressing after this surgery.  He is currently taking Celebrex, which alleviates some pain, and Mounjaro  for diabetes management, with an A1c of 5.2. No history of gout, ulcerative colitis, Crohn's disease, or celiac disease in his family, although his mother had  arthritis.  He denies alcohol and tobacco use and has no history of cancer, except for a benign thyroglossal duct cyst removed in 1983. He is concerned about maintaining his independence and quality of life as his hand condition progresses.  No other joint pain reported. No history of fever. Reports hands are always warm.     Activities of Daily Living:  Patient reports morning stiffness for minutes.   Patient Denies nocturnal pain.  Difficulty dressing/grooming: Reports Difficulty climbing stairs: Denies Difficulty getting out of chair: Denies Difficulty using hands for taps, buttons, cutlery, and/or writing: Reports  Review of Systems  Constitutional:  Negative for fatigue.  HENT:  Negative for mouth sores and mouth dryness.   Eyes:  Positive for dryness.  Respiratory:  Negative for shortness of breath.   Cardiovascular:  Negative for chest pain and palpitations.  Gastrointestinal:  Negative for blood in stool, constipation and diarrhea.  Endocrine: Negative for increased urination.  Genitourinary:  Negative for involuntary urination.  Musculoskeletal:  Positive for joint pain, joint pain, joint swelling and morning stiffness. Negative for gait problem, myalgias, muscle weakness, muscle tenderness and myalgias.  Skin:  Positive for hair loss. Negative for color change, rash and sensitivity to sunlight.  Allergic/Immunologic: Negative for susceptible to infections.  Neurological:  Negative for dizziness and headaches.  Hematological:  Negative for swollen glands.  Psychiatric/Behavioral:  Negative for depressed mood and sleep disturbance. The patient is not nervous/anxious.  PMFS History:  Patient Active Problem List   Diagnosis Date Noted   Spondylolisthesis of lumbar region 05/25/2022   Weak urinary stream 04/10/2020   Benign prostatic hyperplasia with urinary obstruction 04/10/2020   Osteoarthritis of carpometacarpal (CMC) joint of thumb 12/20/2018   Diabetes mellitus  (HCC) 12/20/2018   Hypertensive disorder 12/20/2018   Hypercholesterolemia 12/20/2018   Carpal tunnel syndrome of left wrist 12/20/2018   Sacroiliac joint pain 05/17/2018   Arthritis of wrist 07/19/2017    Past Medical History:  Diagnosis Date   Cataracts, bilateral    Complication of anesthesia    Diabetes mellitus without complication (HCC)    Hyperlipidemia    Hypertension    PONV (postoperative nausea and vomiting)     Family History  Problem Relation Age of Onset   Heart failure Father    Healthy Sister    Colon cancer Sister    Cervical cancer Sister    Hypertension Brother    Diabetes Brother    Heart disease Brother    Stroke Brother    Colon cancer Brother    Colon cancer Brother    Prostate cancer Brother    Past Surgical History:  Procedure Laterality Date   CATARACT EXTRACTION, BILATERAL     CYST REMOVAL NECK  1983   had n & v   EYE SURGERY     LASIK     SHOULDER SURGERY Left 09/2021   TRANSFORAMINAL LUMBAR INTERBODY FUSION (TLIF) WITH PEDICLE SCREW FIXATION 2 LEVEL N/A 05/25/2022   Procedure: Open Lumbar Four-Lumbar Five, Lumbar Five-Sacral One Laminectomy and Transforaminal Lumber Interbody Fusion, posterior spinal fusion with removal of right side synovial cyst;  Surgeon: Cheryle Debby LABOR, MD;  Location: [facility] OR;  Service: Neurosurgery;  Laterality: N/A;   Social History   Tobacco Use   Smoking status: Never    Passive exposure: Never   Smokeless tobacco: Never  Vaping Use   Vaping status: Never Used  Substance Use Topics   Alcohol use: No   Drug use: No   Social History   Social History Narrative   Not on file     Immunization History  Administered Date(s) Administered   Influenza-Unspecified 01/27/2018     Objective: Vital Signs: BP 114/73 (BP Location: Right Arm, Patient Position: Sitting, Cuff Size: Small)   Pulse 61   Temp (!) 97.5 F (36.4 C)   Resp 12   Ht 5' 10 (1.778 m)   Wt 218 lb (98.9 kg)   BMI 31.28 kg/m     Physical Exam Vitals and nursing note reviewed.  HENT:     Head: Normocephalic and atraumatic.     Nose: Nose normal.  Eyes:     Conjunctiva/sclera: Conjunctivae normal.     Pupils: Pupils are equal, round, and reactive to light.  Cardiovascular:     Rate and Rhythm: Normal rate and regular rhythm.  Pulmonary:     Effort: Pulmonary effort is normal. No respiratory distress.  Musculoskeletal:     Comments: Suspected tophi present on multiple DIP's b/l hands.  Bilateral Heberden's and Bouchard's nodes present   Skin:    General: Skin is warm and dry.  Neurological:     Mental Status: He is alert. Mental status is at baseline.  Psychiatric:        Mood and Affect: Mood normal.        Behavior: Behavior normal.      Musculoskeletal Exam:   CDAI Exam: CDAI Score: -- Patient Global: --; Provider  Global: -- Swollen: 0 ; Tender: 6  Joint Exam 01/19/2024      Right  Left  MCP 2   Tender   Tender  MCP 3      Tender  PIP 5 (finger)   Tender     DIP 3 (finger)   Tender     DIP 4 (finger)      Tender     Investigation: No additional findings.  Imaging: No results found.  Recent Labs: Lab Results  Component Value Date   WBC 6.2 05/19/2022   HGB 14.7 05/19/2022   PLT 223 05/19/2022   NA 136 05/19/2022   K 4.1 05/19/2022   CL 103 05/19/2022   CO2 25 05/19/2022   GLUCOSE 112 (H) 05/19/2022   BUN 24 (H) 05/19/2022   CREATININE 1.06 05/19/2022   BILITOT 0.5 05/15/2021   ALKPHOS 87 05/15/2021   AST 29 05/15/2021   ALT 29 05/15/2021   PROT 5.6 (L) 05/15/2021   ALBUMIN  4.1 05/15/2021   CALCIUM  9.8 05/19/2022   GFRAA >60 09/14/2019    Speciality Comments: No specialty comments available.  Procedures:  No procedures performed Allergies: Other   Assessment / Plan:     Visit Diagnoses:  Polyarthralgia  Patient with b/l hand pain and significant nodular changes. He has pictures of XR's of his b/l hands present (pictures in media) that demonstrate findings  consistent with erosive OA. This correlates with the nodular changes present on multiple PIP's and DIP's bilaterally. However, he does have findings consistent with tophi on exam, raising some suspicion for an underlying gout as a secondary cause of his symptoms. Will obtain uric acid today, however unclear how accurate this will be given acute symptoms. Given this, will obtain CT hand via gout protocol to assess how much of his current presentation is due to underlying tophi formation.  Although very low suspicion, will obtain RF, CCP, and inflammatory markers to rule out inflammatory markers.   Orders: Orders Placed This Encounter  Procedures   CT HAND LEFT WO CONTRAST   Rheumatoid factor   Cyclic citrul peptide antibody, IgG   Sedimentation rate   C-reactive protein   Uric acid   No orders of the defined types were placed in this encounter.  I personally spent a total of 60 minutes in the care of the patient today including preparing to see the patient, getting/reviewing separately obtained history, performing a medically appropriate exam/evaluation, counseling and educating, placing orders, and documenting clinical information in the EHR.   Follow-Up Instructions: Return in about 4 weeks (around 02/16/2024).   Asberry Claw, DO  Note - This record has been created using Animal nutritionist.  Chart creation errors have been sought, but may not always  have been located. Such creation errors do not reflect on  the standard of medical care.

## 2024-01-20 ENCOUNTER — Other Ambulatory Visit: Payer: Self-pay | Admitting: Student

## 2024-01-20 ENCOUNTER — Ambulatory Visit: Attending: Student | Admitting: Student

## 2024-01-20 ENCOUNTER — Encounter: Payer: Self-pay | Admitting: Student

## 2024-01-20 VITALS — BP 100/60 | HR 91 | Ht 70.0 in | Wt 214.0 lb

## 2024-01-20 DIAGNOSIS — Z87898 Personal history of other specified conditions: Secondary | ICD-10-CM

## 2024-01-20 DIAGNOSIS — I2510 Atherosclerotic heart disease of native coronary artery without angina pectoris: Secondary | ICD-10-CM

## 2024-01-20 DIAGNOSIS — E7800 Pure hypercholesterolemia, unspecified: Secondary | ICD-10-CM

## 2024-01-20 DIAGNOSIS — I10 Essential (primary) hypertension: Secondary | ICD-10-CM | POA: Diagnosis not present

## 2024-01-20 MED ORDER — OLMESARTAN MEDOXOMIL 20 MG PO TABS: 10.0000 mg | ORAL_TABLET | Freq: Every day | ORAL | Status: AC

## 2024-01-20 MED ORDER — HYDROCHLOROTHIAZIDE 12.5 MG PO CAPS: 12.5000 mg | ORAL_CAPSULE | Freq: Every day | ORAL | 0 refills | Status: DC

## 2024-01-20 NOTE — Patient Instructions (Addendum)
 Medication Instructions:   DECREASE Hydrochlorothiazide  to 12.5 mg daily   Labwork: None today  Testing/Procedures: None today  Follow-Up: 1 year Dr.Ross or [place] Strader,PA-C  Any Other Special Instructions Will Be Listed Below (If Applicable).   Return blood pressure log in 1 month thru MyChart or drop off at office.  If you need a refill on your cardiac medications before your next appointment, please call your pharmacy.

## 2024-01-21 LAB — RHEUMATOID FACTOR: Rheumatoid fact SerPl-aCnc: <10 [IU]/mL (ref <14)

## 2024-01-21 LAB — URIC ACID: Uric Acid, Serum: 5.5 mg/dL (ref 4.0–8.0)

## 2024-01-21 LAB — C-REACTIVE PROTEIN: CRP: <3 mg/L (ref <8.0)

## 2024-01-21 LAB — SEDIMENTATION RATE: Sed Rate: 2 mm/h (ref 0–20)

## 2024-01-21 LAB — CYCLIC CITRUL PEPTIDE ANTIBODY, IGG: Cyclic Citrullin Peptide Ab: <16 U

## 2024-02-17 NOTE — Progress Notes (Unsigned)
 Office Visit Note  Patient: Malik Bowen             Date of Birth: May 28, 1950           MRN: [id number]             PCP: Shona Norleen PEDLAR, MD Referring: Shona Norleen PEDLAR, MD Visit Date: 03/01/2024 Occupation: Data Unavailable  Subjective:  Results   History of Present Illness: Malik Bowen is a 73 y.o. male who presents for results follow-up. He was last seen as a new patient on 01/19/24 for bilateral hand pain. Further work-up revealed erosive osteoarthritis.    Discussed with patient lab results including negative inflammatory markers, negative RF and CCP, and normal uric acid level. Also discussed DECT results that demonstrated moderate to severe degenerative changes second through fifth DIP and PIP joints with cyst/erosive changes. Narrowed fifth PIP joints with erosive changes and remodeling. Severe degenerative changes of the first [facility] joint with mild subluxation. No monosodium urate (MSU) crystal deposition identified.   Patient denies any new or worsneing symptoms. He continues to admit to pain in his hands that is worse with activity. No new concerns at this time.   Activities of Daily Living:  Patient reports morning stiffness for 5 minutes.   Patient Denies nocturnal pain.  Difficulty dressing/grooming: Reports Difficulty climbing stairs: Denies Difficulty getting out of chair: Denies Difficulty using hands for taps, buttons, cutlery, and/or writing: Reports  Review of Systems  Constitutional:  Negative for fatigue.  HENT:  Negative for mouth sores and mouth dryness.   Eyes:  Positive for dryness.  Respiratory:  Negative for shortness of breath.   Cardiovascular:  Negative for chest pain and palpitations.  Gastrointestinal:  Negative for blood in stool, constipation and diarrhea.  Endocrine: Negative for increased urination.  Genitourinary:  Negative for involuntary urination.  Musculoskeletal:  Positive for joint pain, joint pain and morning stiffness.  Negative for gait problem, joint swelling, myalgias, muscle weakness, muscle tenderness and myalgias.  Skin:  Negative for color change, rash, hair loss and sensitivity to sunlight.  Allergic/Immunologic: Negative for susceptible to infections.  Neurological:  Negative for dizziness and headaches.  Hematological:  Negative for swollen glands.  Psychiatric/Behavioral:  Negative for depressed mood and sleep disturbance. The patient is not nervous/anxious.     PMFS History:  Patient Active Problem List   Diagnosis Date Noted   Spondylolisthesis of lumbar region 05/25/2022   Weak urinary stream 04/10/2020   Benign prostatic hyperplasia with urinary obstruction 04/10/2020   Osteoarthritis of carpometacarpal (CMC) joint of thumb 12/20/2018   Diabetes mellitus (HCC) 12/20/2018   Hypertensive disorder 12/20/2018   Hypercholesterolemia 12/20/2018   Carpal tunnel syndrome of left wrist 12/20/2018   Sacroiliac joint pain 05/17/2018   Arthritis of wrist 07/19/2017    Past Medical History:  Diagnosis Date   Cataracts, bilateral    Complication of anesthesia    Diabetes mellitus without complication (HCC)    Hyperlipidemia    Hypertension    PONV (postoperative nausea and vomiting)     Family History  Problem Relation Age of Onset   Heart failure Father    Healthy Sister    Colon cancer Sister    Cervical cancer Sister    Hypertension Brother    Diabetes Brother    Heart disease Brother    Stroke Brother    Colon cancer Brother    Colon cancer Brother    Prostate cancer Brother    Past Surgical  History:  Procedure Laterality Date   CATARACT EXTRACTION, BILATERAL     CYST REMOVAL NECK  1983   had n & v   EYE SURGERY     LASIK     REMOVE AND REPLACE LENS Right    SHOULDER SURGERY Left 09/2021   TRANSFORAMINAL LUMBAR INTERBODY FUSION (TLIF) WITH PEDICLE SCREW FIXATION 2 LEVEL N/A 05/25/2022   Procedure: Open Lumbar Four-Lumbar Five, Lumbar Five-Sacral One Laminectomy and  Transforaminal Lumber Interbody Fusion, posterior spinal fusion with removal of right side synovial cyst;  Surgeon: Cheryle Debby LABOR, MD;  Location: [facility] OR;  Service: Neurosurgery;  Laterality: N/A;   Social History   Tobacco Use   Smoking status: Never    Passive exposure: Never   Smokeless tobacco: Never  Vaping Use   Vaping status: Never Used  Substance Use Topics   Alcohol use: No   Drug use: No   Social History   Social History Narrative   Not on file     Immunization History  Administered Date(s) Administered   Influenza-Unspecified 01/27/2018     Objective: Vital Signs: BP 112/70 (BP Location: Left Arm, Patient Position: Sitting)   Pulse 68   Temp (!) 97.4 F (36.3 C)   Resp 13   Ht 5' 11 (1.803 m)   Wt 219 lb 9.6 oz (99.6 kg)   BMI 30.63 kg/m    Physical Exam Vitals and nursing note reviewed.  HENT:     Head: Normocephalic and atraumatic.     Nose: Nose normal.  Eyes:     Conjunctiva/sclera: Conjunctivae normal.     Pupils: Pupils are equal, round, and reactive to light.  Pulmonary:     Effort: Pulmonary effort is normal. No respiratory distress.  Skin:    General: Skin is warm and dry.  Neurological:     Mental Status: He is alert. Mental status is at baseline.  Psychiatric:        Mood and Affect: Mood normal.        Behavior: Behavior normal.      Musculoskeletal Exam:   CDAI Exam: CDAI Score: -- Patient Global: --; Provider Global: -- Swollen: --; Tender: -- Joint Exam 03/01/2024   No joint exam has been documented for this visit   There is currently no information documented on the homunculus. Go to the Rheumatology activity and complete the homunculus joint exam.  Investigation: No additional findings.  Imaging: No results found.  Recent Labs: Lab Results  Component Value Date   WBC 6.2 05/19/2022   HGB 14.7 05/19/2022   PLT 223 05/19/2022   NA 136 05/19/2022   K 4.1 05/19/2022   CL 103 05/19/2022   CO2 25 05/19/2022    GLUCOSE 112 (H) 05/19/2022   BUN 24 (H) 05/19/2022   CREATININE 1.06 05/19/2022   BILITOT 0.5 05/15/2021   ALKPHOS 87 05/15/2021   AST 29 05/15/2021   ALT 29 05/15/2021   PROT 5.6 (L) 05/15/2021   ALBUMIN  4.1 05/15/2021   CALCIUM  9.8 05/19/2022   GFRAA >60 09/14/2019    Speciality Comments: No specialty comments available.  Procedures:  No procedures performed Allergies: Other   Assessment / Plan:     Visit Diagnoses:  Erosive osteoarthritis Patient with new diagnosis of erosive OA, as documented on recent DECT. No concern for underlying inflammatory arthritis due to the patient's presentation and image findings.   Discussed diagnosis with patient and limited treatment options. Discussed treatment options including NSAIDS PRN, topical Voltaren gel BID  PRN, Tylenol  1g TID as needed or 650 QID PRN, Physical therapy referral, Occupational/hand therapy referral, Discussed range of motion and dexterity exercises, passive stretching of hands/wrists. Heat therapy with Paraffin wax or heated gloves. Patient verbalized understanding.  Orders: No orders of the defined types were placed in this encounter.  No orders of the defined types were placed in this encounter.  I personally spent a total of 30 minutes in the care of the patient today including preparing to see the patient, getting/reviewing separately obtained history, performing a medically appropriate exam/evaluation, counseling and educating, documenting clinical information in the EHR, independently interpreting results, and communicating results.   Follow-Up Instructions: Return if symptoms worsen or fail to improve.   Asberry Claw, DO  Note - This record has been created using Animal nutritionist.  Chart creation errors have been sought, but may not always  have been located. Such creation errors do not reflect on  the standard of medical care.

## 2024-03-01 ENCOUNTER — Ambulatory Visit

## 2024-03-01 VITALS — BP 112/70 | HR 68 | Temp 97.4°F | Resp 13 | Ht 71.0 in | Wt 219.6 lb

## 2024-03-01 DIAGNOSIS — M2550 Pain in unspecified joint: Secondary | ICD-10-CM

## 2024-03-01 DIAGNOSIS — M154 Erosive (osteo)arthritis: Secondary | ICD-10-CM

## 2024-03-01 NOTE — Patient Instructions (Signed)
 Medications used for treatment of Erosive Osteoarthritis: -NSAIDS (Celebrex) -Topical NSAIDS (Topical Voltaren) -Acetaminophen  -Tramadol (Non-Opoid Pain Medication) -Duloxetine (SNRI)  Non-Medications used for treatment of Erosive Osteoarthritis: -Paraffin Wax -CMC Braces  -Exercises (Hand Therapy)

## 2024-03-13 ENCOUNTER — Other Ambulatory Visit (HOSPITAL_COMMUNITY)

## 2024-08-06 ENCOUNTER — Encounter (INDEPENDENT_AMBULATORY_CARE_PROVIDER_SITE_OTHER): Admitting: Ophthalmology
